# Patient Record
Sex: Male | Born: 1975 | Race: Black or African American | Hispanic: No | Marital: Single | Smoking: Current every day smoker
Health system: Southern US, Community
[De-identification: ages and names within clinical notes are randomized; demographics above are authoritative.]

## PROBLEM LIST (undated history)

## (undated) DIAGNOSIS — D571 Sickle-cell disease without crisis: Secondary | ICD-10-CM

## (undated) DIAGNOSIS — Z21 Asymptomatic human immunodeficiency virus [HIV] infection status: Secondary | ICD-10-CM

## (undated) DIAGNOSIS — I10 Essential (primary) hypertension: Secondary | ICD-10-CM

## (undated) HISTORY — PX: TONSILLECTOMY: SUR1361

---

## 2002-03-08 ENCOUNTER — Emergency Department (HOSPITAL_COMMUNITY): Admission: EM | Admit: 2002-03-08 | Discharge: 2002-03-08 | Payer: Self-pay | Admitting: Emergency Medicine

## 2002-03-08 ENCOUNTER — Encounter: Payer: Self-pay | Admitting: Emergency Medicine

## 2003-04-25 ENCOUNTER — Emergency Department (HOSPITAL_COMMUNITY): Admission: AD | Admit: 2003-04-25 | Discharge: 2003-04-26 | Payer: Self-pay | Admitting: Emergency Medicine

## 2004-05-07 ENCOUNTER — Emergency Department (HOSPITAL_COMMUNITY): Admission: EM | Admit: 2004-05-07 | Discharge: 2004-05-07 | Payer: Self-pay | Admitting: Emergency Medicine

## 2005-03-28 ENCOUNTER — Emergency Department (HOSPITAL_COMMUNITY): Admission: EM | Admit: 2005-03-28 | Discharge: 2005-03-28 | Payer: Self-pay | Admitting: Emergency Medicine

## 2005-04-20 ENCOUNTER — Emergency Department (HOSPITAL_COMMUNITY): Admission: EM | Admit: 2005-04-20 | Discharge: 2005-04-20 | Payer: Self-pay | Admitting: Emergency Medicine

## 2005-05-12 ENCOUNTER — Emergency Department (HOSPITAL_COMMUNITY): Admission: EM | Admit: 2005-05-12 | Discharge: 2005-05-12 | Payer: Self-pay | Admitting: Emergency Medicine

## 2005-05-26 ENCOUNTER — Emergency Department (HOSPITAL_COMMUNITY): Admission: EM | Admit: 2005-05-26 | Discharge: 2005-05-26 | Payer: Self-pay | Admitting: Emergency Medicine

## 2005-07-31 ENCOUNTER — Emergency Department (HOSPITAL_COMMUNITY): Admission: EM | Admit: 2005-07-31 | Discharge: 2005-07-31 | Payer: Self-pay | Admitting: Emergency Medicine

## 2005-11-12 ENCOUNTER — Emergency Department (HOSPITAL_COMMUNITY): Admission: EM | Admit: 2005-11-12 | Discharge: 2005-11-12 | Payer: Self-pay | Admitting: Emergency Medicine

## 2005-11-25 ENCOUNTER — Emergency Department (HOSPITAL_COMMUNITY): Admission: EM | Admit: 2005-11-25 | Discharge: 2005-11-25 | Payer: Self-pay | Admitting: Emergency Medicine

## 2006-07-19 ENCOUNTER — Emergency Department (HOSPITAL_COMMUNITY): Admission: EM | Admit: 2006-07-19 | Discharge: 2006-07-19 | Payer: Self-pay | Admitting: Emergency Medicine

## 2006-07-21 ENCOUNTER — Emergency Department (HOSPITAL_COMMUNITY): Admission: EM | Admit: 2006-07-21 | Discharge: 2006-07-21 | Payer: Self-pay | Admitting: Emergency Medicine

## 2007-01-30 ENCOUNTER — Emergency Department (HOSPITAL_COMMUNITY): Admission: EM | Admit: 2007-01-30 | Discharge: 2007-01-31 | Payer: Self-pay | Admitting: Emergency Medicine

## 2007-02-03 ENCOUNTER — Emergency Department (HOSPITAL_COMMUNITY): Admission: EM | Admit: 2007-02-03 | Discharge: 2007-02-03 | Payer: Self-pay | Admitting: Family Medicine

## 2009-12-23 ENCOUNTER — Emergency Department (HOSPITAL_COMMUNITY): Admission: EM | Admit: 2009-12-23 | Discharge: 2009-12-23 | Payer: Self-pay | Admitting: Emergency Medicine

## 2010-05-01 LAB — STREP A DNA PROBE: Group A Strep Probe: POSITIVE

## 2010-11-26 LAB — BASIC METABOLIC PANEL
BUN: 15
Chloride: 103
GFR calc non Af Amer: >60
Potassium: 3.5
Sodium: 135

## 2010-11-26 LAB — DIFFERENTIAL
Basophils Absolute: 0
Lymphocytes Relative: 62 — ABNORMAL HIGH
Lymphs Abs: 2.7
Monocytes Absolute: 0.3
Neutro Abs: 1.2 — ABNORMAL LOW

## 2010-11-26 LAB — ETHANOL

## 2010-11-26 LAB — RAPID URINE DRUG SCREEN, HOSP PERFORMED

## 2010-11-26 LAB — CBC
HCT: 40.3
Hemoglobin: 13.8
RBC: 4.33
RDW: 13.5
WBC: 4.3

## 2010-11-26 LAB — ACETAMINOPHEN LEVEL

## 2011-04-06 ENCOUNTER — Emergency Department (HOSPITAL_COMMUNITY)
Admission: EM | Admit: 2011-04-06 | Discharge: 2011-04-06 | Disposition: A | Discharge status: Home / Self Care | Payer: Self-pay | Attending: Emergency Medicine | Admitting: Emergency Medicine

## 2011-04-06 ENCOUNTER — Encounter (HOSPITAL_COMMUNITY): Payer: Self-pay

## 2011-04-06 DIAGNOSIS — I10 Essential (primary) hypertension: Secondary | ICD-10-CM | POA: Insufficient documentation

## 2011-04-06 DIAGNOSIS — T7840XA Allergy, unspecified, initial encounter: Secondary | ICD-10-CM | POA: Insufficient documentation

## 2011-04-06 DIAGNOSIS — R21 Rash and other nonspecific skin eruption: Secondary | ICD-10-CM | POA: Insufficient documentation

## 2011-04-06 DIAGNOSIS — H5789 Other specified disorders of eye and adnexa: Secondary | ICD-10-CM | POA: Insufficient documentation

## 2011-04-06 MED ORDER — BACITRACIN ZINC 500 UNIT/GM EX OINT
TOPICAL_OINTMENT | CUTANEOUS | Status: AC
  Filled 2011-04-06: qty 1.8

## 2011-04-06 MED ORDER — DIPHENHYDRAMINE HCL 50 MG/ML IJ SOLN
INTRAMUSCULAR | Status: AC
  Filled 2011-04-06: qty 1

## 2011-04-06 MED ORDER — FAMOTIDINE IN NACL 20-0.9 MG/50ML-% IV SOLN
INTRAVENOUS | Status: AC
  Filled 2011-04-06: qty 50

## 2011-04-06 MED ORDER — DIPHENHYDRAMINE HCL 25 MG PO CAPS
ORAL_CAPSULE | ORAL | Status: AC
  Administered 2011-04-06: 50 mg via ORAL
  Filled 2011-04-06: qty 2

## 2011-04-06 MED ORDER — DIPHENHYDRAMINE HCL 25 MG PO CAPS
50.0000 mg | ORAL_CAPSULE | Freq: Once | ORAL | Status: AC
  Administered 2011-04-06: 50 mg via ORAL

## 2011-04-06 MED ORDER — FAMOTIDINE 20 MG PO TABS
20.0000 mg | ORAL_TABLET | Freq: Once | ORAL | Status: AC
  Administered 2011-04-06: 20 mg via ORAL

## 2011-04-06 MED ORDER — FAMOTIDINE 20 MG PO TABS
ORAL_TABLET | ORAL | Status: AC
  Administered 2011-04-06: 20 mg via ORAL
  Filled 2011-04-06: qty 1

## 2011-04-06 NOTE — ED Notes (Signed)
Pt reports feeling as if his tongue started to swell apx 2 hours ago after having his eyebrows threaded and a cream applied to the area, skin around eyebrows also red and swollen with small blisters noted

## 2011-04-06 NOTE — Discharge Instructions (Signed)
Allergic Reaction, Mild to Moderate Allergies may happen from anything your body is sensitive to. This may be food, medications, pollens, chemicals, and nearly anything around you in everyday life that produces allergens. An allergen is anything that causes an allergy producing substance. Allergens cause your body to release allergic antibodies. Through a chain of events, they cause a release of histamine into the blood stream. Histamines are meant to protect you, but they also cause your discomfort. This is why antihistamines are often used for allergies. Heredity is often a factor in causing allergic reactions. This means you may have some of the same allergies as your parents. Allergies happen in all age groups. You may have some idea of what caused your reaction. There are many allergens around us. It may be difficult to know what caused your reaction. If this is a first time event, it may never happen again. Allergies cannot be cured but can be controlled with medications. SYMPTOMS  You may get some or all of the following problems from allergies.  Swelling and itching in and around the mouth.   Tearing, itchy eyes.   Nasal congestion and runny nose.   Sneezing and coughing.   An itchy red rash or hives.   Vomiting or diarrhea.   Difficulty breathing.  Seasonal allergies occur in all age groups. They are seasonal because they usually occur during the same season every year. They may be a reaction to molds, grass pollens, or tree pollens. Other causes of allergies are house dust mite allergens, pet dander and mold spores. These are just a common few of the thousands of allergens around us. All of the symptoms listed above happen when you come in contact with pollens and other allergens. Seasonal allergies are usually not life threatening. They are generally more of a nuisance that can often be handled using medications. Hay fever is a combination of all or some of the above listed allergy  problems. It may often be treated with simple over-the-counter medications such as diphenhydramine. Take medication as directed. Check with your caregiver or package insert for child dosages. TREATMENT AND HOME CARE INSTRUCTIONS If hives or rash are present:  Take medications as directed.   You may use an over-the-counter antihistamine (diphenhydramine) for hives and itching as needed. Do not drive or drink alcohol until medications used to treat the reaction have worn off. Antihistamines tend to make people sleepy.   Apply cold cloths (compresses) to the skin or take baths in cool water. This will help itching. Avoid hot baths or showers. Heat will make a rash and itching worse.   If your allergies persist and become more severe, and over the counter medications are not effective, there are many new medications your caretaker can prescribe. Immunotherapy or desensitizing injections can be used if all else fails. Follow up with your caregiver if problems continue.  SEEK MEDICAL CARE IF:   Your allergies are becoming progressively more troublesome.   You suspect a food allergy. Symptoms generally happen within 30 minutes of eating a food.   Your symptoms have not gone away within 2 days or are getting worse.   You develop new symptoms.   You want to retest yourself or your child with a food or drink you think causes an allergic reaction. Never test yourself or your child of a suspected allergy without being under the watchful eye of your caregivers. A second exposure to an allergen may be life-threatening.  SEEK IMMEDIATE MEDICAL CARE IF:  You   develop difficulty breathing or wheezing, or have a tight feeling in your chest or throat.   You develop a swollen mouth, hives, swelling, or itching all over your body.  A severe reaction with any of the above problems should be considered life-threatening. If you suddenly develop difficulty breathing call for local emergency medical help. THIS IS AN  EMERGENCY. MAKE SURE YOU:   Understand these instructions.   Will watch your condition.   Will get help right away if you are not doing well or get worse.  Document Released: 12/02/2006 Document Revised: 10/17/2010 Document Reviewed: 12/02/2006 ExitCare Patient Information 2012 [employer].  RESOURCE GUIDE  Dental Problems  Patients with Medicaid: [facility]                     [facility] [address]                                           [address] Phone:  [phone]                                                  Phone:  [phone]  If unable to pay or uninsured, contact:  [facility] or [facility] to become qualified for the adult dental clinic.  Chronic Pain Problems Contact [facility]  [phone] Patients need to be referred by their primary care doctor.  Insufficient Money for Medicine Contact United Way:  call "211" or [facility] 271-5999.  No Primary Care Doctor Call [facility]  832-8000 Other agencies that provide inexpensive medical care    [facility]  [phone]    [facility]  [phone]    [facility]  [phone]    [facility] Clinic  [phone]    [facility]  [phone]    [facility]  [phone]  Psychological Services [facility]  [phone] [facility]  [phone] [facility]   800 853-5163 (emergency services 641-4993)  Substance Abuse Resources [facility]  336-882-2125 [facility] 336-784-9470 [facility] 336-285-9073 [facility] 336-845-3988 Residential & Outpatient Substance Abuse Program  800-659-3381  Abuse/Neglect [place] [facility] Hotline (336) 641-3795 [place] [facility] Hotline 800-378-5315 (After Hours)  Emergency Shelter [facility] (336) 271-5985  [facility] (336) 275-9566 [facility] (704) 372-4663  MRSA Hotline #:   832-7006    [place] [facility]  [facility] [place]     [employer]                          [facility] [address] [place]                       [address]      [address]  [place]                                                  [place]                            [place] Phone:  [phone]                                   Phone:  [phone]                 Phone:  [phone]  [facility] Phone:  [phone]  [place] Child Abuse Hotline (336) 342-1394 (336) 342-3537 (After Hours)   

## 2011-04-06 NOTE — ED Notes (Signed)
Continuous cardiac monitor showing normal sinus rhythm, no ectopy. Continuous pulse oximetry in place. Automatic BP cuff in place.  Pt admits to having a history of hypertension, does not take medications.  Discussed with pt the importance of monitoring and getting medical follow up/treatment for his blood pressure, pt verbalizes understanding.

## 2011-04-06 NOTE — ED Notes (Signed)
Pt in c/o allergic reaction, tongue and throat swelling, states he has used his EPI pen, states he feels like swelling is getting worse, primary RN at bedside, MD Kohut notified.

## 2011-04-06 NOTE — ED Provider Notes (Signed)
History    36 year old male presenting with a presumed allergic reaction. Shortly after having his eyebrows "threaded" patient began having local irritation of his eyebrow area. Felt like his tongue was swelling as well. Patient had an EpiPen which she used. Since onset about an hour ago patient feels like symptoms have been stable. No shortness of breath. No abdominal pain nausea, vomiting or diarrhea.  CSN: [id number]  Arrival date & time 04/06/11  1342   First MD Initiated Contact with Patient 04/06/11 1344      Chief Complaint  Patient presents with  . Allergic Reaction    (Consider location/radiation/quality/duration/timing/severity/associated sxs/prior treatment) HPI  History reviewed. No pertinent past medical history.  History reviewed. No pertinent past surgical history.  History reviewed. No pertinent family history.  History  Substance Use Topics  . Smoking status: Current Everyday Smoker    Types: Cigarettes  . Smokeless tobacco: Not on file  . Alcohol Use: No      Review of Systems   Review of symptoms negative unless otherwise noted in HPI.   Allergies  Review of patient's allergies indicates no known allergies.  Home Medications  No current outpatient prescriptions on file.  BP 138/103  Pulse 60  Temp(Src) 98.6 F (37 C) (Oral)  Resp 13  SpO2 99%  Physical Exam  Nursing note and vitals reviewed. Constitutional: He appears well-developed and well-nourished. No distress.  HENT:  Head: Normocephalic and atraumatic.  Mouth/Throat: Oropharynx is clear and moist.       Oropharynx is clear. There is no appreciable facial or tongue swelling. Patient is handling his secretions. There is no stridor. Neck is supple.  Eyes: Conjunctivae and EOM are normal. Pupils are equal, round, and reactive to light. Right eye exhibits no discharge. Left eye exhibits no discharge.       Bilateral high para region with mild erythema and minimal swelling. There are no  other significant. Oral skin changes. Conjunctiva are not injected. There is no drainage.  Neck: Neck supple.  Cardiovascular: Normal rate, regular rhythm and normal heart sounds.  Exam reveals no gallop and no friction rub.   No murmur heard. Pulmonary/Chest: Effort normal and breath sounds normal. No respiratory distress.  Abdominal: Soft. He exhibits no distension. There is no tenderness.  Musculoskeletal: He exhibits no edema and no tenderness.  Neurological: He is alert.  Skin: Skin is warm and dry. He is not diaphoretic.  Psychiatric: He has a normal mood and affect. His behavior is normal. Thought content normal.    ED Course  Procedures (including critical care time)  Labs Reviewed - No data to display No results found.   1. Allergic reaction       MDM  36 year old male with symptoms consistent local irritation to "threading" of eyebrows or topical agent used afterwards or less likely allergic reaction. Given the use of an EpiPen the patient was observed for possible rebound symptoms. Throughout the observation period patient remained clinically stable and upon discharge reported that symptoms have almost completely resolved. Repeat examination continues to show no respiratory distress. Return precautions were discussed. Outpatient followup as needed.         Raeford Razor, MD 04/14/11 [phone]

## 2011-04-06 NOTE — ED Notes (Signed)
Pt is alert and oriented x 4 with respirations even and unlabored.  Pt speaking clearly and in complete sentences.  NAD at this time.  Discharge instructions reviewed with patient and patient verbalized understanding.  Pt ambulated to lobby with steady gait and significant other to transport patient home.

## 2011-04-06 NOTE — ED Notes (Signed)
Continuous cardiac monitor applied showing normal sinus rhythm, no ectopy Continuous pulse oximetry applied. Automatic BP cuff applied.  

## 2011-05-20 ENCOUNTER — Encounter (HOSPITAL_COMMUNITY): Payer: Self-pay | Admitting: *Deleted

## 2011-05-20 ENCOUNTER — Emergency Department (HOSPITAL_COMMUNITY)
Admission: EM | Admit: 2011-05-20 | Discharge: 2011-05-20 | Disposition: A | Discharge status: Home / Self Care | Payer: Self-pay | Attending: Emergency Medicine | Admitting: Emergency Medicine

## 2011-05-20 DIAGNOSIS — D571 Sickle-cell disease without crisis: Secondary | ICD-10-CM | POA: Insufficient documentation

## 2011-05-20 DIAGNOSIS — R10819 Abdominal tenderness, unspecified site: Secondary | ICD-10-CM | POA: Insufficient documentation

## 2011-05-20 DIAGNOSIS — Z21 Asymptomatic human immunodeficiency virus [HIV] infection status: Secondary | ICD-10-CM | POA: Insufficient documentation

## 2011-05-20 DIAGNOSIS — R109 Unspecified abdominal pain: Secondary | ICD-10-CM | POA: Insufficient documentation

## 2011-05-20 DIAGNOSIS — R197 Diarrhea, unspecified: Secondary | ICD-10-CM | POA: Insufficient documentation

## 2011-05-20 DIAGNOSIS — I10 Essential (primary) hypertension: Secondary | ICD-10-CM | POA: Insufficient documentation

## 2011-05-20 DIAGNOSIS — F17200 Nicotine dependence, unspecified, uncomplicated: Secondary | ICD-10-CM | POA: Insufficient documentation

## 2011-05-20 HISTORY — DX: Asymptomatic human immunodeficiency virus (hiv) infection status: Z21

## 2011-05-20 HISTORY — DX: Essential (primary) hypertension: I10

## 2011-05-20 HISTORY — DX: Sickle-cell disease without crisis: D57.1

## 2011-05-20 LAB — DIFFERENTIAL
Eosinophils Absolute: 0.1 10*3/uL (ref 0.0–0.7)
Lymphs Abs: 2 10*3/uL (ref 0.7–4.0)
Monocytes Absolute: 0.4 10*3/uL (ref 0.1–1.0)
Monocytes Relative: 8 % (ref 3–12)
Neutro Abs: 2.6 10*3/uL (ref 1.7–7.7)
Neutrophils Relative %: 51 % (ref 43–77)

## 2011-05-20 LAB — COMPREHENSIVE METABOLIC PANEL
Alkaline Phosphatase: 95 U/L (ref 39–117)
BUN: 12 mg/dL (ref 6–23)
Chloride: 106 mEq/L (ref 96–112)
GFR calc Af Amer: >90 mL/min (ref >90)
GFR calc non Af Amer: >90 mL/min (ref >90)
Glucose, Bld: 83 mg/dL (ref 70–99)
Potassium: 3.4 mEq/L — ABNORMAL LOW (ref 3.5–5.1)
Total Bilirubin: 0.2 mg/dL — ABNORMAL LOW (ref 0.3–1.2)

## 2011-05-20 LAB — CBC
HCT: 41.1 % (ref 39.0–52.0)
Hemoglobin: 14.3 g/dL (ref 13.0–17.0)
MCH: 32.8 pg (ref 26.0–34.0)
RBC: 4.36 MIL/uL (ref 4.22–5.81)

## 2011-05-20 LAB — URINALYSIS, ROUTINE W REFLEX MICROSCOPIC
Leukocytes, UA: NEGATIVE
Nitrite: NEGATIVE
Specific Gravity, Urine: 1.032 — ABNORMAL HIGH (ref 1.005–1.030)
Urobilinogen, UA: 0.2 mg/dL (ref 0.0–1.0)

## 2011-05-20 LAB — LIPASE, BLOOD: Lipase: 14 U/L (ref 11–59)

## 2011-05-20 MED ORDER — POTASSIUM CHLORIDE 20 MEQ/15ML (10%) PO LIQD
20.0000 meq | Freq: Once | ORAL | Status: AC
  Administered 2011-05-20: 20 meq via ORAL
  Filled 2011-05-20: qty 15

## 2011-05-20 MED ORDER — DICYCLOMINE HCL 10 MG/ML IM SOLN
20.0000 mg | Freq: Once | INTRAMUSCULAR | Status: AC
  Administered 2011-05-20: 20 mg via INTRAMUSCULAR
  Filled 2011-05-20: qty 2

## 2011-05-20 NOTE — ED Notes (Signed)
Pt was unable to use the bathroom for stool collection.

## 2011-05-20 NOTE — ED Notes (Signed)
Pt states "for 4 days I've been having abdominal pain & loose bowel movements, a couple of days before that I had eaten a couple of raw oysters"

## 2011-05-20 NOTE — ED Notes (Signed)
Tried to collect stool sample again and pt was unable to use the bathroom.

## 2011-05-20 NOTE — ED Provider Notes (Signed)
History     CSN: [id number]  Arrival date & time 05/20/11  1111   First MD Initiated Contact with Patient 05/20/11 1139      Chief Complaint  Patient presents with  . Abdominal Pain  . 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 The patient states that it has been present for the past 4-5 days. He describes diffuse crampy abdominal pain which has been intermittent every 15 minutes or so. This amount of watery diarrhea every 15 minutes as well. He denies fevers, blood in the stools. There is been no recent travel outside the country or swimming in [place]. He denies nausea, vomiting. He states he has a normal appetite but that occasionally he is seeing undigested food in his diarrhea. There's been no rash. He states that he did have raw oysters several days ago. With his antiretrovirals. No history of AIDS. No recent abx use.  Denies hematuria/dysuria/freq/urgency    Past Medical History  Diagnosis Date  . HIV positive   . Sickle cell anemia   . Hypertension     Past Surgical History  Procedure Date  . Tonsillectomy     No family history on file.  History  Substance Use Topics  . Smoking status: Current Everyday Smoker -- 0.5 packs/day    Types: Cigarettes  . Smokeless tobacco: Not on file  . Alcohol Use: Yes     ocassionally      Review of Systems  All other systems reviewed and are negative.   except as noted HPI   Allergies  Review of patient's allergies indicates no known allergies.  Home Medications   Current Outpatient Rx  Name Route Sig Dispense Refill  . EFAVIRENZ-EMTRICITAB-TENOFOVIR 600-200-300 MG PO TABS Oral Take 1 tablet by mouth at bedtime.    Marland Kitchen VALACYCLOVIR HCL 1 G PO TABS Oral Take 1,000 mg by mouth daily.      BP 135/97  Pulse 87  Temp(Src) 98 F (36.7 C) (Oral)   Resp 20  Wt 140 lb (63.504 kg)  SpO2 100%  Physical Exam  Nursing note and vitals reviewed. Constitutional: He is oriented to person, place, and time. He appears well-developed and well-nourished. No distress.  HENT:  Head: Atraumatic.  Mouth/Throat: Oropharynx is clear and moist.  Eyes: Conjunctivae are normal. Pupils are equal, round, and reactive to light.  Neck: Neck supple.  Cardiovascular: Normal rate, regular rhythm, normal heart sounds and intact distal pulses.  Exam reveals no gallop and no friction rub.   No murmur heard. Pulmonary/Chest: Effort normal. No respiratory distress. He has no wheezes. He has no rales.  Abdominal: Soft. Bowel sounds are normal. There is tenderness. There is no rebound and no guarding.       Minimal diffuse Abdominal tenderness to palpation  Musculoskeletal: Normal range of motion. He exhibits no edema and no tenderness.  Neurological: He is alert and oriented to person, place, and time.  Skin: Skin is warm and dry.  Psychiatric: He has a normal mood and affect.    ED Course  Procedures (including critical care time)  Labs Reviewed  COMPREHENSIVE METABOLIC PANEL - Abnormal; Notable for the following:    Potassium 3.4 (*)    Total Bilirubin 0.2 (*)    All other components within normal limits  URINALYSIS, ROUTINE W REFLEX MICROSCOPIC - Abnormal; Notable for the following:    Specific Gravity, Urine  1.032 (*)    Bilirubin Urine SMALL (*)    Ketones, ur TRACE (*)    All other components within normal limits  CBC  DIFFERENTIAL  LIPASE, BLOOD  CRYPTOSPORIDIUM SMEAR, FECAL  STOOL CULTURE   No results found.   1. Diarrhea    MDM  Crampy abdominal pain and diarrhea. H/o HIV but if patient's history is reliable, he is not immune compromised at this time. UA, stool studies incl cx and cryptosporidium, check electrolytes. Bentyl. Appears well-hydrated.   Notable for slightly decreased potassium which has been repleted in the emergency  department. His pain/cramping has improved with bentyl. Trying to give stool sample in ED. Stable for f/u as outpatient with ID or PMD.        Forbes Cellar, MD 05/20/11 1452

## 2011-05-20 NOTE — Discharge Instructions (Signed)
Diarrhea Infections caused by germs (bacterial) or a virus commonly cause diarrhea. Your caregiver has determined that with time, rest and fluids, the diarrhea should improve. In general, eat normally while drinking more water than usual. Although water may prevent dehydration, it does not contain salt and minerals (electrolytes). Broths, weak tea without caffeine and oral rehydration solutions (ORS) replace fluids and electrolytes. Small amounts of fluids should be taken frequently. Large amounts at one time may not be tolerated. Plain water may be harmful in infants and the elderly. Oral rehydrating solutions (ORS) are available at pharmacies and grocery stores. ORS replace water and important electrolytes in proper proportions. Sports drinks are not as effective as ORS and may be harmful due to sugars worsening diarrhea.  ORS is especially recommended for use in children with diarrhea. As a general guideline for children, replace any new fluid losses from diarrhea and/or vomiting with ORS as follows:   If your child weighs 22 pounds or under (10 kg or less), give 60-120 mL ( -  cup or 2 - 4 ounces) of ORS for each episode of diarrheal stool or vomiting episode.   If your child weighs more than 22 pounds (more than 10 kgs), give 120-240 mL ( - 1 cup or 4 - 8 ounces) of ORS for each diarrheal stool or episode of vomiting.   While correcting for dehydration, children should eat normally. However, foods high in sugar should be avoided because this may worsen diarrhea. Large amounts of carbonated soft drinks, juice, gelatin desserts and other highly sugared drinks should be avoided.   After correction of dehydration, other liquids that are appealing to the child may be added. Children should drink small amounts of fluids frequently and fluids should be increased as tolerated. Children should drink enough fluids to keep urine clear or pale yellow.   Adults should eat normally while drinking more fluids  than usual. Drink small amounts of fluids frequently and increase as tolerated. Drink enough fluids to keep urine clear or pale yellow. Broths, weak decaffeinated tea, lemon lime soft drinks (allowed to go flat) and ORS replace fluids and electrolytes.   Avoid:   Carbonated drinks.   Juice.   Extremely hot or cold fluids.   Caffeine drinks.   Fatty, greasy foods.   Alcohol.   Tobacco.   Too much intake of anything at one time.   Gelatin desserts.   Probiotics are active cultures of beneficial bacteria. They may lessen the amount and number of diarrheal stools in adults. Probiotics can be found in yogurt with active cultures and in supplements.   Wash hands well to avoid spreading bacteria and virus.   Anti-diarrheal medications are not recommended for infants and children.   Only take over-the-counter or prescription medicines for pain, discomfort or fever as directed by your caregiver. Do not give aspirin to children because it may cause Reye's Syndrome.   For adults, ask your caregiver if you should continue all prescribed and over-the-counter medicines.   If your caregiver has given you a follow-up appointment, it is very important to keep that appointment. Not keeping the appointment could result in a chronic or permanent injury, and disability. If there is any problem keeping the appointment, you must call back to this facility for assistance.  SEEK IMMEDIATE MEDICAL CARE IF:   You or your child is unable to keep fluids down or other symptoms or problems become worse in spite of treatment.   Vomiting or diarrhea develops and becomes persistent.     There is vomiting of blood or bile (green material).   There is blood in the stool or the stools are black and tarry.   There is no urine output in 6-8 hours or there is only a small amount of very dark urine.   Abdominal pain develops, increases or localizes.   You have a fever.   Your baby is older than 3 months with a  rectal temperature of 102 F (38.9 C) or higher.   Your baby is 3 months old or younger with a rectal temperature of 100.4 F (38 C) or higher.   You or your child develops excessive weakness, dizziness, fainting or extreme thirst.   You or your child develops a rash, stiff neck, severe headache or become irritable or sleepy and difficult to awaken.  MAKE SURE YOU:   Understand these instructions.   Will watch your condition.   Will get help right away if you are not doing well or get worse.  Document Released: 01/25/2002 Document Revised: 01/24/2011 Document Reviewed: 12/12/2008 ExitCare Patient Information 2012 [employer].  RESOURCE GUIDE  Dental Problems  Patients with Medicaid: [facility]                     [facility] [address]                                           [address] Phone:  [phone]                                                   Phone:  [phone]  If unable to pay or uninsured, contact:  [facility] or [facility] to become qualified for the adult dental clinic.  Chronic Pain Problems Contact [facility]  [phone] Patients need to be referred by their primary care doctor.  Insufficient Money for Medicine Contact United Way:  call "211" or [facility] 271-5999.  No Primary Care Doctor Call [facility]  832-8000 Other agencies that provide inexpensive medical care    [facility]  [phone]     Internal Medicine  [phone]    [facility]  [phone]    [facility]'s Clinic  [phone]    [facility]  [phone]    [facility]  [phone]  Psychological Services [facility]  [phone] [facility]  [phone] [facility]   800 853-5163 (emergency services [phone])  Abuse/Neglect [place] [facility] Hotline (336) 641-3795 [place] [facility] Hotline 800-378-5315 (After  Hours)  Emergency Shelter [facility] (336) 271-5985  [facility] (336) 275-9566 [facility] (704) 372-4663  MRSA Hotline #:   832-7006    [place] [facility]  [facility] [place]  [employer]                           [facility] [address] [place]                     [address]           [address]  [place]                                               [place]                              [place] Phone:  [phone]                                  Phone:  [phone]                   Phone:  [phone]  [facility] Phone:  [phone]  [place] Child Abuse Hotline (336) 342-1394 (336) 342-3537 (After Hours)  

## 2011-05-20 NOTE — ED Notes (Signed)
Pt is unable to go to the bathroom for labs. Gave crackers and ginger ale per Dr.

## 2011-05-22 ENCOUNTER — Emergency Department (HOSPITAL_COMMUNITY): Payer: Self-pay

## 2011-05-22 ENCOUNTER — Encounter (HOSPITAL_COMMUNITY): Payer: Self-pay | Admitting: *Deleted

## 2011-05-22 ENCOUNTER — Emergency Department (HOSPITAL_COMMUNITY)
Admission: EM | Admit: 2011-05-22 | Discharge: 2011-05-22 | Disposition: A | Discharge status: Home Health | Payer: Self-pay | Attending: Emergency Medicine | Admitting: Emergency Medicine

## 2011-05-22 DIAGNOSIS — K5289 Other specified noninfective gastroenteritis and colitis: Secondary | ICD-10-CM | POA: Insufficient documentation

## 2011-05-22 DIAGNOSIS — K529 Noninfective gastroenteritis and colitis, unspecified: Secondary | ICD-10-CM

## 2011-05-22 DIAGNOSIS — Z21 Asymptomatic human immunodeficiency virus [HIV] infection status: Secondary | ICD-10-CM | POA: Insufficient documentation

## 2011-05-22 DIAGNOSIS — R197 Diarrhea, unspecified: Secondary | ICD-10-CM | POA: Insufficient documentation

## 2011-05-22 DIAGNOSIS — Z79899 Other long term (current) drug therapy: Secondary | ICD-10-CM | POA: Insufficient documentation

## 2011-05-22 DIAGNOSIS — D571 Sickle-cell disease without crisis: Secondary | ICD-10-CM | POA: Insufficient documentation

## 2011-05-22 DIAGNOSIS — R1915 Other abnormal bowel sounds: Secondary | ICD-10-CM | POA: Insufficient documentation

## 2011-05-22 DIAGNOSIS — R1084 Generalized abdominal pain: Secondary | ICD-10-CM | POA: Insufficient documentation

## 2011-05-22 DIAGNOSIS — I10 Essential (primary) hypertension: Secondary | ICD-10-CM | POA: Insufficient documentation

## 2011-05-22 LAB — URINALYSIS, ROUTINE W REFLEX MICROSCOPIC
Nitrite: NEGATIVE
Specific Gravity, Urine: 1.039 — ABNORMAL HIGH (ref 1.005–1.030)
Urobilinogen, UA: 0.2 mg/dL (ref 0.0–1.0)

## 2011-05-22 LAB — DIFFERENTIAL
Eosinophils Absolute: 0.1 10*3/uL (ref 0.0–0.7)
Lymphs Abs: 2.2 10*3/uL (ref 0.7–4.0)
Monocytes Absolute: 0.7 10*3/uL (ref 0.1–1.0)
Monocytes Relative: 12 % (ref 3–12)
Neutrophils Relative %: 51 % (ref 43–77)

## 2011-05-22 LAB — COMPREHENSIVE METABOLIC PANEL
Alkaline Phosphatase: 87 U/L (ref 39–117)
BUN: 16 mg/dL (ref 6–23)
Creatinine, Ser: 1.11 mg/dL (ref 0.50–1.35)
GFR calc Af Amer: >90 mL/min (ref >90)
Glucose, Bld: 96 mg/dL (ref 70–99)
Potassium: 3.6 mEq/L (ref 3.5–5.1)
Total Bilirubin: 0.2 mg/dL — ABNORMAL LOW (ref 0.3–1.2)
Total Protein: 7.2 g/dL (ref 6.0–8.3)

## 2011-05-22 LAB — CBC
HCT: 41 % (ref 39.0–52.0)
Hemoglobin: 14.2 g/dL (ref 13.0–17.0)
MCH: 33.2 pg (ref 26.0–34.0)
MCV: 95.8 fL (ref 78.0–100.0)
RBC: 4.28 MIL/uL (ref 4.22–5.81)

## 2011-05-22 LAB — LIPASE, BLOOD: Lipase: 29 U/L (ref 11–59)

## 2011-05-22 MED ORDER — HYDROMORPHONE HCL PF 1 MG/ML IJ SOLN
1.0000 mg | Freq: Once | INTRAMUSCULAR | Status: AC
  Administered 2011-05-22: 1 mg via INTRAVENOUS
  Filled 2011-05-22: qty 1

## 2011-05-22 MED ORDER — METRONIDAZOLE IN NACL 5-0.79 MG/ML-% IV SOLN
500.0000 mg | Freq: Once | INTRAVENOUS | Status: AC
  Administered 2011-05-22: 500 mg via INTRAVENOUS
  Filled 2011-05-22: qty 100

## 2011-05-22 MED ORDER — IOHEXOL 300 MG/ML  SOLN
100.0000 mL | Freq: Once | INTRAMUSCULAR | Status: AC | PRN
  Administered 2011-05-22: 100 mL via INTRAVENOUS

## 2011-05-22 MED ORDER — CIPROFLOXACIN HCL 500 MG PO TABS: 500.0000 mg | ORAL_TABLET | Freq: Two times a day (BID) | ORAL | Status: AC

## 2011-05-22 MED ORDER — ONDANSETRON HCL 4 MG/2ML IJ SOLN
4.0000 mg | Freq: Once | INTRAMUSCULAR | Status: AC
  Administered 2011-05-22: 4 mg via INTRAVENOUS
  Filled 2011-05-22: qty 2

## 2011-05-22 MED ORDER — OXYCODONE-ACETAMINOPHEN 5-325 MG PO TABS: 1.0000 | ORAL_TABLET | ORAL | Status: AC | PRN

## 2011-05-22 MED ORDER — SODIUM CHLORIDE 0.9 % IV BOLUS (SEPSIS)
1000.0000 mL | Freq: Once | INTRAVENOUS | Status: AC
  Administered 2011-05-22: 1000 mL via INTRAVENOUS

## 2011-05-22 MED ORDER — METRONIDAZOLE 500 MG PO TABS: 500.0000 mg | ORAL_TABLET | Freq: Two times a day (BID) | ORAL | Status: AC

## 2011-05-22 MED ORDER — CIPROFLOXACIN IN D5W 400 MG/200ML IV SOLN
400.0000 mg | Freq: Once | INTRAVENOUS | Status: AC
  Administered 2011-05-22: 400 mg via INTRAVENOUS
  Filled 2011-05-22: qty 200

## 2011-05-22 NOTE — Discharge Instructions (Signed)
Bloody Diarrhea  Bloody diarrhea can be caused by many different conditions. Most of the time bloody diarrhea is the result of food poisoning or minor infections. Bloody diarrhea usually improves over 2 to 3 days of rest and fluid replacement. Other conditions that can cause bloody diarrhea include:  · Internal bleeding.  · Infection.  · Diseases of the bowel and colon.  Internal bleeding from an ulcer or bowel disease can be severe and requires hospital care or even surgery.  DIAGNOSIS   To find out what is wrong your caregiver may check your:  · Stool.  · Blood.  · Results from a test that looks inside the body (endoscopy).  TREATMENT   · Get plenty of rest.  · Drink enough water and fluids to keep your urine clear or pale yellow.  · Do not smoke.  · Solid foods and dairy products should be avoided until your illness improves.  · As you improve, slowly return to a regular diet with easily-digested foods first. Examples are:  · Bananas.  · Rice.  · Toast.  · Crackers.  You should only need these for about 2 days before adding more normal foods to your diet.  · Avoid spicy or fatty foods as well as caffeine and alcohol for several days.  · Medicine to control cramping and diarrhea can relieve symptoms but may prolong some cases of bloody diarrhea. Antibiotics can speed recovery from diarrhea due to some bacterial infections. Call your caregiver if diarrhea does not get better in 3 days.  SEEK MEDICAL CARE IF:   · You do not improve after 3 days.  · Your diarrhea improves but your stool appears black.  SEEK IMMEDIATE MEDICAL CARE IF:   · You become extremely weak or faint.  · You become very sweaty.  · You have increased pain or bleeding.  · You develop repeated vomiting.  · You vomit and you see blood or the vomit looks black in color.  · You have a fever.  Document Released: 02/04/2005 Document Revised: 01/24/2011 Document Reviewed: 01/06/2009  [employer]® Patient Information ©2012 [employer].

## 2011-05-22 NOTE — ED Notes (Signed)
Pt reports abd pain and diarrhea x1 wk. Denies n/v. Sts "thought I saw some blood in stool yesterday." Seen here 2 days ago for same, symptoms continued. Has tried Pepto-bismal without relief.

## 2011-05-22 NOTE — ED Provider Notes (Signed)
History     CSN: [id number]  Arrival date & time 05/22/11  0701   First MD Initiated Contact with Patient 05/22/11 [phone]      Chief Complaint  Patient presents with  . Abdominal Pain    (Consider location/radiation/quality/duration/timing/severity/associated sxs/prior treatment) HPI  Patient states abdominal pain entire abdomen for six days.  Cramping in nature, waxes and wanes.  Increases with laying on back,  Moving, or eating.  Decreases being on knees.  Patient with watery diarrhea every thirty minutes.  Patient seen here two days ago and given shot.  Patient states he has eaten oysters.  Patient drinking fluids.  States decrease urination.  No fever or chills.  ID at [facility]- 7 years- on atripla, on cd4 count "good" and viral load "undectectable" per patient.  Sickle cell trait- Dr. Alben Spittle [facility] , no local pmd.  Patient denies lightheaded or weakness.    Past Medical History  Diagnosis Date  . HIV positive   . Sickle cell anemia   . Hypertension     Past Surgical History  Procedure Date  . Tonsillectomy     No family history on file.  History  Substance Use Topics  . Smoking status: Current Everyday Smoker -- 0.5 packs/day    Types: Cigarettes  . Smokeless tobacco: Not on file  . Alcohol Use: Yes     ocassionally      Review of Systems  All other systems reviewed and are negative.    Allergies  Review of patient's allergies indicates no known allergies.  Home Medications   Current Outpatient Rx  Name Route Sig Dispense Refill  . EFAVIRENZ-EMTRICITAB-TENOFOVIR 600-200-300 MG PO TABS Oral Take 1 tablet by mouth at bedtime.    Marland Kitchen VALACYCLOVIR HCL 500 MG PO TABS Oral Take 1,000 mg by mouth 2 (two) times daily.      BP 118/78  Pulse 88  Temp(Src) 98.3 F (36.8 C) (Oral)  Resp 24  SpO2 97%  Physical Exam  Nursing note and vitals reviewed. Constitutional: He is oriented to person, place, and time. He appears well-developed and well-nourished.  HENT:    Head: Normocephalic and atraumatic.  Right Ear: External ear normal.  Left Ear: External ear normal.  Nose: Nose normal.  Mouth/Throat: Oropharynx is clear and moist.  Eyes: Conjunctivae and EOM are normal. Pupils are equal, round, and reactive to light.  Neck: Normal range of motion. Neck supple.  Cardiovascular: Normal rate, regular rhythm, normal heart sounds and intact distal pulses.   Pulmonary/Chest: Effort normal and breath sounds normal.  Abdominal: Soft. There is tenderness.       Diffuse tenderness with increased bowel sounds, positive rebound, greates rlq  Musculoskeletal: Normal range of motion.  Neurological: He is alert and oriented to person, place, and time. He has normal reflexes.  Skin: Skin is warm and dry.  Psychiatric: He has a normal mood and affect. His behavior is normal. Thought content normal.    ED Course  Procedures (including critical care time)  Labs Reviewed - No data to display No results found.   No diagnosis found.  Results for orders placed during the hospital encounter of 05/22/11  CBC      Component Value Range   WBC 6.1  4.0 - 10.5 (K/uL)   RBC 4.28  4.22 - 5.81 (MIL/uL)   Hemoglobin 14.2  13.0 - 17.0 (g/dL)   HCT 29.5  62.1 - 30.8 (%)   MCV 95.8  78.0 - 100.0 (fL)   MCH  33.2  26.0 - 34.0 (pg)   MCHC 34.6  30.0 - 36.0 (g/dL)   RDW 16.1  09.6 - 04.5 (%)   Platelets 214  150 - 400 (K/uL)  DIFFERENTIAL      Component Value Range   Neutrophils Relative 51  43 - 77 (%)   Neutro Abs 3.1  1.7 - 7.7 (K/uL)   Lymphocytes Relative 35  12 - 46 (%)   Lymphs Abs 2.2  0.7 - 4.0 (K/uL)   Monocytes Relative 12  3 - 12 (%)   Monocytes Absolute 0.7  0.1 - 1.0 (K/uL)   Eosinophils Relative 2  0 - 5 (%)   Eosinophils Absolute 0.1  0.0 - 0.7 (K/uL)   Basophils Relative 0  0 - 1 (%)   Basophils Absolute 0.0  0.0 - 0.1 (K/uL)  COMPREHENSIVE METABOLIC PANEL      Component Value Range   Sodium 139  135 - 145 (mEq/L)   Potassium 3.6  3.5 - 5.1  (mEq/L)   Chloride 106  96 - 112 (mEq/L)   CO2 24  19 - 32 (mEq/L)   Glucose, Bld 96  70 - 99 (mg/dL)   BUN 16  6 - 23 (mg/dL)   Creatinine, Ser 4.09  0.50 - 1.35 (mg/dL)   Calcium 9.0  8.4 - 81.1 (mg/dL)   Total Protein 7.2  6.0 - 8.3 (g/dL)   Albumin 3.5  3.5 - 5.2 (g/dL)   AST 21  0 - 37 (U/L)   ALT 10  0 - 53 (U/L)   Alkaline Phosphatase 87  39 - 117 (U/L)   Total Bilirubin 0.2 (*) 0.3 - 1.2 (mg/dL)   GFR calc non Af Amer 85 (*) >90 (mL/min)   GFR calc Af Amer >90  >90 (mL/min)  LIPASE, BLOOD      Component Value Range   Lipase 29  11 - 59 (U/L)  URINALYSIS, ROUTINE W REFLEX MICROSCOPIC      Component Value Range   Color, Urine YELLOW  YELLOW    APPearance CLOUDY (*) CLEAR    Specific Gravity, Urine 1.039 (*) 1.005 - 1.030    pH 5.5  5.0 - 8.0    Glucose, UA NEGATIVE  NEGATIVE (mg/dL)   Hgb urine dipstick NEGATIVE  NEGATIVE    Bilirubin Urine SMALL (*) NEGATIVE    Ketones, ur TRACE (*) NEGATIVE (mg/dL)   Protein, ur NEGATIVE  NEGATIVE (mg/dL)   Urobilinogen, UA 0.2  0.0 - 1.0 (mg/dL)   Nitrite NEGATIVE  NEGATIVE    Leukocytes, UA NEGATIVE  NEGATIVE    Ct Abdomen Pelvis W Contrast  05/22/2011  *RADIOLOGY REPORT*  Clinical Data:  abdominal pain, weight loss, diarrhea, HIV  CT ABDOMEN AND PELVIS WITH CONTRAST  Technique:  Multidetector CT imaging of the abdomen and pelvis was performed following the standard protocol during bolus administration of intravenous contrast.  Contrast:  100 ml Omnipaque-300  Comparison: None.  Findings: Minimal dependent basilar atelectasis.  Normal heart size.  No pericardial or pleural effusion.  No hiatal hernia.  Abdomen:  Liver, gallbladder, biliary system, pancreas, spleen, kidneys, and adrenal glands are within normal limits and demonstrate no acute process.  No abdominal free fluid, fluid collection, hemorrhage, hematoma, abscess, or adenopathy.  Several loops of small bowel demonstrate wall prominence/mild wall thickening involving the distal  jejunum and ileum without obstruction, pneumatosis or free air.  Appearances compatible with mild enteritis.  Colon is fluid-filled with scattered air fluid levels compatible with ongoing  diarrhea.  No colonic wall thickening or colitis identified.  Normal appendix in the right lower quadrant.  Pelvis:  No pelvic free fluid, fluid collection, hemorrhage, hematoma, adenopathy, or inguinal abnormality.  Urinary bladder unremarkable.  No acute abnormal osseous finding.  IMPRESSION: Mild distal small bowel wall thickening compatible with nonspecific enteritis.  Fluid-filled colon with air-fluid levels compatible with ongoing diarrhea  Negative for obstruction or abscess  Normal appendix  Original Report Authenticated By: Judie Petit. Ruel Favors, M.D.    MDM  Patient with normal labs. Some slight distal small bowel wall thickening seen on CT scan  patient given Cipro and Flagyl here. He'll be placed on outpatient Cipro and Flagyl. Oral rehydration is discussed with patient. He'll be given Percocet for pain.      Hilario Quarry, MD 05/23/11 [phone]

## 2011-05-24 LAB — STOOL CULTURE

## 2011-06-21 ENCOUNTER — Encounter (HOSPITAL_COMMUNITY): Payer: Self-pay | Admitting: *Deleted

## 2011-06-21 ENCOUNTER — Emergency Department (HOSPITAL_COMMUNITY)
Admission: EM | Admit: 2011-06-21 | Discharge: 2011-06-21 | Disposition: A | Discharge status: Home / Self Care | Payer: Self-pay | Attending: Emergency Medicine | Admitting: Emergency Medicine

## 2011-06-21 DIAGNOSIS — F17200 Nicotine dependence, unspecified, uncomplicated: Secondary | ICD-10-CM | POA: Insufficient documentation

## 2011-06-21 DIAGNOSIS — I10 Essential (primary) hypertension: Secondary | ICD-10-CM | POA: Insufficient documentation

## 2011-06-21 DIAGNOSIS — K625 Hemorrhage of anus and rectum: Secondary | ICD-10-CM | POA: Insufficient documentation

## 2011-06-21 DIAGNOSIS — K6289 Other specified diseases of anus and rectum: Secondary | ICD-10-CM | POA: Insufficient documentation

## 2011-06-21 DIAGNOSIS — D571 Sickle-cell disease without crisis: Secondary | ICD-10-CM | POA: Insufficient documentation

## 2011-06-21 DIAGNOSIS — Z21 Asymptomatic human immunodeficiency virus [HIV] infection status: Secondary | ICD-10-CM | POA: Insufficient documentation

## 2011-06-21 MED ORDER — LIDOCAINE HCL 1 % IJ SOLN
INTRAMUSCULAR | Status: AC
  Administered 2011-06-21: 20 mL
  Filled 2011-06-21: qty 20

## 2011-06-21 MED ORDER — DOCUSATE SODIUM 100 MG PO CAPS: 100.0000 mg | ORAL_CAPSULE | Freq: Two times a day (BID) | ORAL | Status: AC

## 2011-06-21 MED ORDER — CEFTRIAXONE SODIUM 250 MG IJ SOLR
250.0000 mg | Freq: Once | INTRAMUSCULAR | Status: AC
  Administered 2011-06-21: 250 mg via INTRAMUSCULAR
  Filled 2011-06-21: qty 250

## 2011-06-21 MED ORDER — OXYCODONE-ACETAMINOPHEN 5-325 MG PO TABS: 1.0000 | ORAL_TABLET | ORAL | Status: AC | PRN

## 2011-06-21 MED ORDER — DOXYCYCLINE HYCLATE 100 MG PO CAPS: 100.0000 mg | ORAL_CAPSULE | Freq: Two times a day (BID) | ORAL | Status: AC

## 2011-06-21 NOTE — ED Notes (Signed)
Colon biopsy 3 weeks ago to test for cancer,  Pt states he has "HIV"

## 2011-06-21 NOTE — ED Notes (Signed)
Pt denies complaints at this time. Resp easy non labored. Skin warm and dry.

## 2011-06-21 NOTE — ED Notes (Signed)
No reaction noted.

## 2011-06-21 NOTE — ED Provider Notes (Signed)
History    36 year old male with rectal pain and bleeding. Onset about 5 days ago. Did have receptive anal intercourse day of onset. Pain at rest but worse with coughing, movement and defecation. Small amount of prior red blood. Patient also reports occasionally seeing mucus in the stool, particularly in the morning. No abdominal or back pain. 2 urinary complaints. No fevers or chills. Marland Kitchen He is HIV-positive. Is followed by infectious disease doctor in [place]. Per patient, his CD4 count is "good."  CSN: [id number]  Arrival date & time 06/21/11  1948   First MD Initiated Contact with Patient 06/21/11 2138      Chief Complaint  Patient presents with  . Rectal Pain    mucous drainage from rectum in morning  . Rectal Bleeding    (Consider location/radiation/quality/duration/timing/severity/associated sxs/prior treatment) HPI  Past Medical History  Diagnosis Date  . HIV positive   . Sickle cell anemia   . Hypertension     Past Surgical History  Procedure Date  . Tonsillectomy     History reviewed. No pertinent family history.  History  Substance Use Topics  . Smoking status: Current Everyday Smoker -- 0.5 packs/day    Types: Cigarettes  . Smokeless tobacco: Not on file  . Alcohol Use: Yes     ocassionally      Review of Systems   Review of symptoms negative unless otherwise noted in HPI.   Allergies  Review of patient's allergies indicates no known allergies.  Home Medications   Current Outpatient Rx  Name Route Sig Dispense Refill  . EFAVIRENZ-EMTRICITAB-TENOFOVIR 600-200-300 MG PO TABS Oral Take 1 tablet by mouth at bedtime.    Marland Kitchen VALACYCLOVIR HCL 500 MG PO TABS Oral Take 1,000 mg by mouth 2 (two) times daily.    Marland Kitchen DOCUSATE SODIUM 100 MG PO CAPS Oral Take 1 capsule (100 mg total) by mouth 2 (two) times daily. 30 capsule 0  . DOXYCYCLINE HYCLATE 100 MG PO CAPS Oral Take 1 capsule (100 mg total) by mouth 2 (two) times daily. 28 capsule 0  .  OXYCODONE-ACETAMINOPHEN 5-325 MG PO TABS Oral Take 1 tablet by mouth every 4 (four) hours as needed for pain. 15 tablet 0    BP 141/88  Pulse 80  Temp(Src) 98.3 F (36.8 C) (Oral)  Resp 18  SpO2 98%  Physical Exam  Nursing note and vitals reviewed. Constitutional: He appears well-developed and well-nourished. No distress.  HENT:  Head: Normocephalic and atraumatic.  Eyes: Conjunctivae are normal. Right eye exhibits no discharge. Left eye exhibits no discharge.  Neck: Neck supple.  Cardiovascular: Normal rate, regular rhythm and normal heart sounds.  Exam reveals no gallop and no friction rub.   No murmur heard. Pulmonary/Chest: Effort normal and breath sounds normal. No respiratory distress.  Abdominal: Soft. He exhibits no distension. There is no tenderness.  Genitourinary:       No external lesions noted. No mass palpated. Exquisite tenderness on DRE. Small streak of blood noted. No mucus or pus. No mass palpated. Anoscopy attempted but aborted because pt would not tolerate.  Musculoskeletal: He exhibits no edema and no tenderness.  Neurological: He is alert.  Skin: Skin is warm and dry.  Psychiatric: He has a normal mood and affect. His behavior is normal. Thought content normal.    ED Course  Procedures (including critical care time)  Labs Reviewed - No data to display No results found.   1. Anal or rectal pain   2. Rectal bleed  MDM  36 year old male with rectal pain and mild bleeding. Possible anal fissure although none visualized. Will treat for possible proctitis given history of receptive anal intercourse. Pain medicines and stool softeners. Followup with his PCP or infectious disease doctor.     Raeford Razor, MD 06/21/11 2208

## 2011-09-22 ENCOUNTER — Emergency Department (HOSPITAL_COMMUNITY)
Admission: EM | Admit: 2011-09-22 | Discharge: 2011-09-23 | Disposition: A | Discharge status: Home / Self Care | Payer: Self-pay | Attending: Emergency Medicine | Admitting: Emergency Medicine

## 2011-09-22 ENCOUNTER — Encounter (HOSPITAL_COMMUNITY): Payer: Self-pay | Admitting: Emergency Medicine

## 2011-09-22 DIAGNOSIS — Z21 Asymptomatic human immunodeficiency virus [HIV] infection status: Secondary | ICD-10-CM | POA: Insufficient documentation

## 2011-09-22 DIAGNOSIS — K5289 Other specified noninfective gastroenteritis and colitis: Secondary | ICD-10-CM | POA: Insufficient documentation

## 2011-09-22 DIAGNOSIS — I10 Essential (primary) hypertension: Secondary | ICD-10-CM | POA: Insufficient documentation

## 2011-09-22 DIAGNOSIS — D571 Sickle-cell disease without crisis: Secondary | ICD-10-CM | POA: Insufficient documentation

## 2011-09-22 DIAGNOSIS — K529 Noninfective gastroenteritis and colitis, unspecified: Secondary | ICD-10-CM

## 2011-09-22 DIAGNOSIS — F17200 Nicotine dependence, unspecified, uncomplicated: Secondary | ICD-10-CM | POA: Insufficient documentation

## 2011-09-22 LAB — COMPREHENSIVE METABOLIC PANEL
Alkaline Phosphatase: 92 U/L (ref 39–117)
BUN: 14 mg/dL (ref 6–23)
Calcium: 9.4 mg/dL (ref 8.4–10.5)
GFR calc Af Amer: >90 mL/min (ref >90)
Glucose, Bld: 92 mg/dL (ref 70–99)
Potassium: 4 mEq/L (ref 3.5–5.1)
Total Protein: 7.2 g/dL (ref 6.0–8.3)

## 2011-09-22 LAB — CBC WITH DIFFERENTIAL/PLATELET
Eosinophils Absolute: 0.1 10*3/uL (ref 0.0–0.7)
Eosinophils Relative: 1 % (ref 0–5)
Hemoglobin: 14.6 g/dL (ref 13.0–17.0)
Lymphs Abs: 2.5 10*3/uL (ref 0.7–4.0)
MCH: 33.5 pg (ref 26.0–34.0)
MCV: 94.5 fL (ref 78.0–100.0)
Monocytes Absolute: 0.6 10*3/uL (ref 0.1–1.0)
Monocytes Relative: 14 % — ABNORMAL HIGH (ref 3–12)
RBC: 4.36 MIL/uL (ref 4.22–5.81)

## 2011-09-22 MED ORDER — ONDANSETRON HCL 4 MG/2ML IJ SOLN
4.0000 mg | Freq: Once | INTRAMUSCULAR | Status: AC
  Administered 2011-09-22: 4 mg via INTRAVENOUS
  Filled 2011-09-22: qty 2

## 2011-09-22 MED ORDER — SODIUM CHLORIDE 0.9 % IV BOLUS (SEPSIS)
1000.0000 mL | Freq: Once | INTRAVENOUS | Status: AC
  Administered 2011-09-22: 1000 mL via INTRAVENOUS

## 2011-09-22 MED ORDER — LOPERAMIDE HCL 2 MG PO CAPS
2.0000 mg | ORAL_CAPSULE | ORAL | Status: DC | PRN
  Administered 2011-09-22: 2 mg via ORAL
  Filled 2011-09-22: qty 1

## 2011-09-22 MED ORDER — HYDROMORPHONE HCL PF 1 MG/ML IJ SOLN
1.0000 mg | Freq: Once | INTRAMUSCULAR | Status: AC
  Administered 2011-09-22: 1 mg via INTRAVENOUS
  Filled 2011-09-22: qty 1

## 2011-09-22 NOTE — ED Notes (Signed)
Per patient starting having diarrhea last week-started having abdominal pain-unable to keep liquids/food down-drank ETOH last night-does not think that helped

## 2011-09-22 NOTE — ED Notes (Signed)
[id number]<AV> Expected date:<BR> Expected time:<BR> Means of arrival:<BR> Comments:<BR> Hold Rm 4
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
## 2011-09-23 LAB — CD4/CD8 (T-HELPER/T-SUPPRESSOR CELL)
CD8tox: 27 % (ref 15.0–40.0)
Total lymphocyte count: 2610 /uL (ref 1000–4000)

## 2011-09-23 MED ORDER — METRONIDAZOLE 500 MG PO TABS: 500.0000 mg | ORAL_TABLET | Freq: Two times a day (BID) | ORAL | Status: AC

## 2011-09-23 MED ORDER — ONDANSETRON HCL 4 MG PO TABS: 4.0000 mg | ORAL_TABLET | Freq: Four times a day (QID) | ORAL | Status: AC

## 2011-09-23 MED ORDER — CIPROFLOXACIN HCL 500 MG PO TABS: 500.0000 mg | ORAL_TABLET | Freq: Two times a day (BID) | ORAL | Status: AC

## 2011-09-23 MED ORDER — OXYCODONE-ACETAMINOPHEN 5-325 MG PO TABS: 1.0000 | ORAL_TABLET | ORAL | Status: AC | PRN

## 2011-09-25 NOTE — ED Provider Notes (Signed)
Medical screening examination/treatment/procedure(s) were performed by non-physician practitioner and as supervising physician I was immediately available for consultation/collaboration.  Azure Budnick R. Prescious Hurless, MD 09/25/11 1637 

## 2011-10-21 ENCOUNTER — Emergency Department (HOSPITAL_COMMUNITY): Payer: Self-pay

## 2011-10-21 ENCOUNTER — Emergency Department (HOSPITAL_COMMUNITY)
Admission: EM | Admit: 2011-10-21 | Discharge: 2011-10-22 | Disposition: A | Discharge status: Home / Self Care | Payer: Self-pay | Attending: Emergency Medicine | Admitting: Emergency Medicine

## 2011-10-21 ENCOUNTER — Encounter (HOSPITAL_COMMUNITY): Payer: Self-pay | Admitting: Emergency Medicine

## 2011-10-21 DIAGNOSIS — Z21 Asymptomatic human immunodeficiency virus [HIV] infection status: Secondary | ICD-10-CM | POA: Insufficient documentation

## 2011-10-21 DIAGNOSIS — I10 Essential (primary) hypertension: Secondary | ICD-10-CM | POA: Insufficient documentation

## 2011-10-21 DIAGNOSIS — F17200 Nicotine dependence, unspecified, uncomplicated: Secondary | ICD-10-CM | POA: Insufficient documentation

## 2011-10-21 DIAGNOSIS — S62609A Fracture of unspecified phalanx of unspecified finger, initial encounter for closed fracture: Secondary | ICD-10-CM | POA: Insufficient documentation

## 2011-10-21 DIAGNOSIS — D571 Sickle-cell disease without crisis: Secondary | ICD-10-CM | POA: Insufficient documentation

## 2011-10-21 DIAGNOSIS — S63259A Unspecified dislocation of unspecified finger, initial encounter: Secondary | ICD-10-CM | POA: Insufficient documentation

## 2011-10-21 MED ORDER — OXYCODONE-ACETAMINOPHEN 5-325 MG PO TABS
2.0000 | ORAL_TABLET | Freq: Once | ORAL | Status: AC
  Administered 2011-10-21: 2 via ORAL
  Filled 2011-10-21: qty 2

## 2011-10-21 MED ORDER — OXYCODONE-ACETAMINOPHEN 5-325 MG PO TABS: 1.0000 | ORAL_TABLET | Freq: Four times a day (QID) | ORAL | Status: AC | PRN

## 2011-10-21 NOTE — ED Notes (Signed)
PA Van Wingen at bedside. 

## 2011-10-21 NOTE — ED Notes (Signed)
Patient transported to X-ray 

## 2011-10-21 NOTE — ED Notes (Signed)
Ice provided for R hand.

## 2011-10-21 NOTE — ED Notes (Signed)
Pt states he has a ride home
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
## 2011-10-21 NOTE — ED Notes (Signed)
PA Anne Shutter at bedside to perform digital block on pt's finger.

## 2011-10-22 NOTE — ED Provider Notes (Signed)
Medical screening examination/treatment/procedure(s) were performed by non-physician practitioner and as supervising physician I was immediately available for consultation/collaboration.    Celene Kras, MD 10/22/11 [phone]

## 2011-11-19 ENCOUNTER — Emergency Department (HOSPITAL_COMMUNITY)
Admission: EM | Admit: 2011-11-19 | Discharge: 2011-11-19 | Disposition: A | Discharge status: Home / Self Care | Payer: Self-pay | Attending: Emergency Medicine | Admitting: Emergency Medicine

## 2011-11-19 ENCOUNTER — Encounter (HOSPITAL_COMMUNITY): Payer: Self-pay | Admitting: Emergency Medicine

## 2011-11-19 ENCOUNTER — Emergency Department (HOSPITAL_COMMUNITY): Payer: Self-pay

## 2011-11-19 DIAGNOSIS — F17200 Nicotine dependence, unspecified, uncomplicated: Secondary | ICD-10-CM | POA: Insufficient documentation

## 2011-11-19 DIAGNOSIS — Z21 Asymptomatic human immunodeficiency virus [HIV] infection status: Secondary | ICD-10-CM | POA: Insufficient documentation

## 2011-11-19 DIAGNOSIS — D571 Sickle-cell disease without crisis: Secondary | ICD-10-CM | POA: Insufficient documentation

## 2011-11-19 DIAGNOSIS — S62639A Displaced fracture of distal phalanx of unspecified finger, initial encounter for closed fracture: Secondary | ICD-10-CM

## 2011-11-19 DIAGNOSIS — S6980XA Other specified injuries of unspecified wrist, hand and finger(s), initial encounter: Secondary | ICD-10-CM | POA: Insufficient documentation

## 2011-11-19 DIAGNOSIS — S6990XA Unspecified injury of unspecified wrist, hand and finger(s), initial encounter: Secondary | ICD-10-CM

## 2011-11-19 DIAGNOSIS — I10 Essential (primary) hypertension: Secondary | ICD-10-CM | POA: Insufficient documentation

## 2011-11-19 MED ORDER — IBUPROFEN 800 MG PO TABS
800.0000 mg | ORAL_TABLET | Freq: Once | ORAL | Status: AC
  Administered 2011-11-19: 800 mg via ORAL
  Filled 2011-11-19: qty 1

## 2011-11-19 MED ORDER — OXYCODONE-ACETAMINOPHEN 5-325 MG PO TABS
2.0000 | ORAL_TABLET | Freq: Once | ORAL | Status: AC
  Administered 2011-11-19: 2 via ORAL
  Filled 2011-11-19: qty 2

## 2011-11-19 MED ORDER — OXYCODONE-ACETAMINOPHEN 5-325 MG PO TABS: 1.0000 | ORAL_TABLET | Freq: Four times a day (QID) | ORAL | Status: DC | PRN
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
## 2011-11-20 NOTE — ED Provider Notes (Signed)
Medical screening examination/treatment/procedure(s) were performed by non-physician practitioner and as supervising physician I was immediately available for consultation/collaboration. Yasmin Bronaugh, MD, FACEP   Nolene Rocks L Nels Munn, MD 11/20/11 2302 

## 2012-02-06 ENCOUNTER — Emergency Department (HOSPITAL_COMMUNITY): Payer: No Typology Code available for payment source

## 2012-02-06 ENCOUNTER — Emergency Department (HOSPITAL_COMMUNITY)
Admission: EM | Admit: 2012-02-06 | Discharge: 2012-02-06 | Disposition: A | Discharge status: Home / Self Care | Payer: No Typology Code available for payment source | Attending: Emergency Medicine | Admitting: Emergency Medicine

## 2012-02-06 ENCOUNTER — Encounter (HOSPITAL_COMMUNITY): Payer: Self-pay | Admitting: Emergency Medicine

## 2012-02-06 DIAGNOSIS — Y939 Activity, unspecified: Secondary | ICD-10-CM | POA: Insufficient documentation

## 2012-02-06 DIAGNOSIS — Z79899 Other long term (current) drug therapy: Secondary | ICD-10-CM | POA: Insufficient documentation

## 2012-02-06 DIAGNOSIS — D571 Sickle-cell disease without crisis: Secondary | ICD-10-CM | POA: Insufficient documentation

## 2012-02-06 DIAGNOSIS — B20 Human immunodeficiency virus [HIV] disease: Secondary | ICD-10-CM | POA: Insufficient documentation

## 2012-02-06 DIAGNOSIS — I10 Essential (primary) hypertension: Secondary | ICD-10-CM | POA: Insufficient documentation

## 2012-02-06 DIAGNOSIS — T148XXA Other injury of unspecified body region, initial encounter: Secondary | ICD-10-CM | POA: Insufficient documentation

## 2012-02-06 DIAGNOSIS — F17200 Nicotine dependence, unspecified, uncomplicated: Secondary | ICD-10-CM | POA: Insufficient documentation

## 2012-02-06 DIAGNOSIS — Y92410 Unspecified street and highway as the place of occurrence of the external cause: Secondary | ICD-10-CM | POA: Insufficient documentation

## 2012-02-06 MED ORDER — CYCLOBENZAPRINE HCL 10 MG PO TABS: 10.0000 mg | ORAL_TABLET | Freq: Two times a day (BID) | ORAL | Status: DC | PRN

## 2012-02-06 MED ORDER — IBUPROFEN 800 MG PO TABS: 800.0000 mg | ORAL_TABLET | Freq: Three times a day (TID) | ORAL | Status: DC

## 2012-02-06 NOTE — Progress Notes (Signed)
Pt also stated pcp at [facility] but unable to provide name
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
## 2012-02-06 NOTE — ED Provider Notes (Signed)
Medical screening examination/treatment/procedure(s) were performed by non-physician practitioner and as supervising physician I was immediately available for consultation/collaboration. Devoria Albe, MD, Armando Gang   Ward Givens, MD 02/06/12 [phone]
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
## 2012-11-23 ENCOUNTER — Emergency Department (HOSPITAL_COMMUNITY)
Admission: EM | Admit: 2012-11-23 | Discharge: 2012-11-23 | Disposition: A | Discharge status: Home / Self Care | Payer: No Typology Code available for payment source | Attending: Emergency Medicine | Admitting: Emergency Medicine

## 2012-11-23 DIAGNOSIS — F17200 Nicotine dependence, unspecified, uncomplicated: Secondary | ICD-10-CM | POA: Insufficient documentation

## 2012-11-23 DIAGNOSIS — I10 Essential (primary) hypertension: Secondary | ICD-10-CM | POA: Insufficient documentation

## 2012-11-23 DIAGNOSIS — Z862 Personal history of diseases of the blood and blood-forming organs and certain disorders involving the immune mechanism: Secondary | ICD-10-CM | POA: Insufficient documentation

## 2012-11-23 DIAGNOSIS — J029 Acute pharyngitis, unspecified: Secondary | ICD-10-CM | POA: Insufficient documentation

## 2012-11-23 DIAGNOSIS — Z21 Asymptomatic human immunodeficiency virus [HIV] infection status: Secondary | ICD-10-CM | POA: Insufficient documentation

## 2012-11-23 DIAGNOSIS — B9789 Other viral agents as the cause of diseases classified elsewhere: Secondary | ICD-10-CM

## 2012-11-23 DIAGNOSIS — Z79899 Other long term (current) drug therapy: Secondary | ICD-10-CM | POA: Insufficient documentation

## 2012-11-23 MED ORDER — IBUPROFEN 800 MG PO TABS
800.0000 mg | ORAL_TABLET | Freq: Once | ORAL | Status: AC
  Administered 2012-11-23: 800 mg via ORAL
  Filled 2012-11-23: qty 1

## 2012-11-23 NOTE — Progress Notes (Signed)
[facility] provided pt with a list of primary care resources and a [facility] [employer] application.  °

## 2012-11-23 NOTE — ED Notes (Signed)
friend at bedside.

## 2012-11-23 NOTE — ED Notes (Signed)
Pt c/o sore throat x 5 days. Pt reports exposure to kids and friends with similar symptoms.

## 2012-11-23 NOTE — ED Provider Notes (Signed)
CSN: [id number]     Arrival date & time 11/23/12  1104 History   First MD Initiated Contact with Patient 11/23/12 1204     Chief Complaint  Patient presents with  . Sore Throat   (Consider location/radiation/quality/duration/timing/severity/associated sxs/prior Treatment) The history is provided by the patient.  Jorge Elliott is a 37 y.o. male history of HIV (on meds with nl CD4 count a year ago), hypertension here presenting with sore throat. Sore for intermittently for the last week. Intermittent fevers as well. Tried NyQuil with minimal relief. He has some sick contacts with similar symptoms. He also has some nonproductive cough.    Past Medical History  Diagnosis Date  . HIV positive   . Sickle cell anemia   . Hypertension    Past Surgical History  Procedure Laterality Date  . Tonsillectomy     No family history on file. History  Substance Use Topics  . Smoking status: Current Every Day Smoker -- 0.50 packs/day    Types: Cigarettes  . Smokeless tobacco: Not on file  . Alcohol Use: Yes     Comment: ocassionally    Review of Systems  HENT: Positive for sore throat.   Respiratory: Positive for cough.   All other systems reviewed and are negative.    Allergies  Pork-derived products  Home Medications   Current Outpatient Rx  Name  Route  Sig  Dispense  Refill  . efavirenz-emtrictabine-tenofovir (ATRIPLA) 600-200-300 MG per tablet   Oral   Take 1 tablet by mouth at bedtime.         . Pseudoeph-Doxylamine-DM-APAP (NYQUIL PO)   Oral   Take 2 tablets by mouth daily as needed (cold).         . valACYclovir (VALTREX) 500 MG tablet   Oral   Take 1,000 mg by mouth daily.           BP 140/97  Pulse 70  Temp(Src) 98.6 F (37 C) (Oral)  Resp 18  SpO2 99% Physical Exam  Nursing note and vitals reviewed. Constitutional: He is oriented to person, place, and time. He appears well-developed and well-nourished.  HENT:  Head: Normocephalic.  OP slightly  red, no enlarged tonsils or exudates   Eyes: Conjunctivae are normal. Pupils are equal, round, and reactive to light.  Neck: Normal range of motion. Neck supple.  Cardiovascular: Normal rate, regular rhythm and normal heart sounds.   Pulmonary/Chest: Effort normal and breath sounds normal. No respiratory distress. He has no wheezes.  Abdominal: Soft. Bowel sounds are normal. He exhibits no distension. There is no tenderness. There is no rebound.  Musculoskeletal: Normal range of motion.  Neurological: He is alert and oriented to person, place, and time.  Skin: Skin is warm and dry.  Psychiatric: He has a normal mood and affect. His behavior is normal. Judgment and thought content normal.    ED Course  Procedures (including critical care time) Labs Review Labs Reviewed  RAPID STREP SCREEN  CULTURE, GROUP A STREP   Imaging Review No results found.  MDM  No diagnosis found. Jorge Elliott is a 37 y.o. male here with sore throat. Likely viral. Low centor score but he is HIV positive. Will do rapid strep and treat if positive.   1:45 PM Strep neg. Stable for d/c. Recommend NSAIDs at home and conservative treatment.   Richardean Canal, MD 11/23/12 1345

## 2012-11-25 LAB — CULTURE, GROUP A STREP
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
## 2015-01-16 ENCOUNTER — Emergency Department (HOSPITAL_COMMUNITY)
Admission: EM | Admit: 2015-01-16 | Discharge: 2015-01-16 | Disposition: A | Discharge status: Home / Self Care | Payer: No Typology Code available for payment source | Attending: Emergency Medicine | Admitting: Emergency Medicine

## 2015-01-16 ENCOUNTER — Encounter (HOSPITAL_COMMUNITY): Payer: Self-pay | Admitting: Emergency Medicine

## 2015-01-16 DIAGNOSIS — S199XXA Unspecified injury of neck, initial encounter: Secondary | ICD-10-CM | POA: Insufficient documentation

## 2015-01-16 DIAGNOSIS — T148XXA Other injury of unspecified body region, initial encounter: Secondary | ICD-10-CM

## 2015-01-16 DIAGNOSIS — Y92410 Unspecified street and highway as the place of occurrence of the external cause: Secondary | ICD-10-CM | POA: Diagnosis not present

## 2015-01-16 DIAGNOSIS — Z21 Asymptomatic human immunodeficiency virus [HIV] infection status: Secondary | ICD-10-CM | POA: Insufficient documentation

## 2015-01-16 DIAGNOSIS — Y9389 Activity, other specified: Secondary | ICD-10-CM | POA: Insufficient documentation

## 2015-01-16 DIAGNOSIS — F17210 Nicotine dependence, cigarettes, uncomplicated: Secondary | ICD-10-CM | POA: Insufficient documentation

## 2015-01-16 DIAGNOSIS — Z79899 Other long term (current) drug therapy: Secondary | ICD-10-CM | POA: Diagnosis not present

## 2015-01-16 DIAGNOSIS — S39012A Strain of muscle, fascia and tendon of lower back, initial encounter: Secondary | ICD-10-CM | POA: Diagnosis not present

## 2015-01-16 DIAGNOSIS — I10 Essential (primary) hypertension: Secondary | ICD-10-CM | POA: Diagnosis not present

## 2015-01-16 DIAGNOSIS — Z862 Personal history of diseases of the blood and blood-forming organs and certain disorders involving the immune mechanism: Secondary | ICD-10-CM | POA: Insufficient documentation

## 2015-01-16 DIAGNOSIS — S3992XA Unspecified injury of lower back, initial encounter: Secondary | ICD-10-CM | POA: Diagnosis present

## 2015-01-16 DIAGNOSIS — Y998 Other external cause status: Secondary | ICD-10-CM | POA: Diagnosis not present

## 2015-01-16 MED ORDER — CYCLOBENZAPRINE HCL 5 MG PO TABS: 5.0000 mg | ORAL_TABLET | Freq: Three times a day (TID) | ORAL | Status: DC | PRN
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
## 2015-01-16 NOTE — Discharge Instructions (Signed)

## 2015-01-16 NOTE — ED Notes (Signed)
Pt stated "I have high blood.  I didn't take my medicine today"  Deliah BostonVrinda, NP informed.  No new orders received.
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
## 2015-01-26 ENCOUNTER — Encounter (HOSPITAL_COMMUNITY): Payer: Self-pay | Admitting: Emergency Medicine

## 2015-01-26 ENCOUNTER — Emergency Department (HOSPITAL_COMMUNITY)
Admission: EM | Admit: 2015-01-26 | Discharge: 2015-01-26 | Disposition: A | Discharge status: Home / Self Care | Payer: No Typology Code available for payment source | Attending: Emergency Medicine | Admitting: Emergency Medicine

## 2015-01-26 DIAGNOSIS — Z79899 Other long term (current) drug therapy: Secondary | ICD-10-CM | POA: Insufficient documentation

## 2015-01-26 DIAGNOSIS — I10 Essential (primary) hypertension: Secondary | ICD-10-CM | POA: Diagnosis not present

## 2015-01-26 DIAGNOSIS — F17210 Nicotine dependence, cigarettes, uncomplicated: Secondary | ICD-10-CM | POA: Diagnosis not present

## 2015-01-26 DIAGNOSIS — Z87828 Personal history of other (healed) physical injury and trauma: Secondary | ICD-10-CM | POA: Diagnosis not present

## 2015-01-26 DIAGNOSIS — Z862 Personal history of diseases of the blood and blood-forming organs and certain disorders involving the immune mechanism: Secondary | ICD-10-CM | POA: Diagnosis not present

## 2015-01-26 DIAGNOSIS — M545 Low back pain: Secondary | ICD-10-CM | POA: Diagnosis not present

## 2015-01-26 DIAGNOSIS — Z21 Asymptomatic human immunodeficiency virus [HIV] infection status: Secondary | ICD-10-CM | POA: Insufficient documentation

## 2015-01-26 MED ORDER — CYCLOBENZAPRINE HCL 5 MG PO TABS: 5.0000 mg | ORAL_TABLET | Freq: Three times a day (TID) | ORAL | Status: DC | PRN

## 2015-01-26 MED ORDER — IBUPROFEN 800 MG PO TABS: 800.0000 mg | ORAL_TABLET | Freq: Three times a day (TID) | ORAL | Status: DC

## 2015-01-26 NOTE — Discharge Instructions (Signed)

## 2015-01-26 NOTE — ED Notes (Signed)
Pt states "I want to avoid pain killers 'cause 4 yrs ago I had a problem with them."
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
## 2015-08-21 ENCOUNTER — Emergency Department (HOSPITAL_COMMUNITY)
Admission: EM | Admit: 2015-08-21 | Discharge: 2015-08-21 | Disposition: A | Discharge status: Home / Self Care | Payer: No Typology Code available for payment source | Attending: Emergency Medicine | Admitting: Emergency Medicine

## 2015-08-21 ENCOUNTER — Encounter (HOSPITAL_COMMUNITY): Payer: Self-pay | Admitting: Emergency Medicine

## 2015-08-21 DIAGNOSIS — I10 Essential (primary) hypertension: Secondary | ICD-10-CM | POA: Insufficient documentation

## 2015-08-21 DIAGNOSIS — Z79899 Other long term (current) drug therapy: Secondary | ICD-10-CM | POA: Insufficient documentation

## 2015-08-21 DIAGNOSIS — F17210 Nicotine dependence, cigarettes, uncomplicated: Secondary | ICD-10-CM | POA: Insufficient documentation

## 2015-08-21 DIAGNOSIS — Z791 Long term (current) use of non-steroidal anti-inflammatories (NSAID): Secondary | ICD-10-CM | POA: Insufficient documentation

## 2015-08-21 DIAGNOSIS — R369 Urethral discharge, unspecified: Secondary | ICD-10-CM

## 2015-08-21 DIAGNOSIS — Z21 Asymptomatic human immunodeficiency virus [HIV] infection status: Secondary | ICD-10-CM | POA: Insufficient documentation

## 2015-08-21 LAB — GC/CHLAMYDIA PROBE AMP (~~LOC~~) NOT AT ARMC
CHLAMYDIA, DNA PROBE: NEGATIVE
Neisseria Gonorrhea: POSITIVE — AB

## 2015-08-21 MED ORDER — LIDOCAINE HCL 1 % IJ SOLN
INTRAMUSCULAR | Status: AC
  Administered 2015-08-21: 0.9 mL
  Filled 2015-08-21: qty 20

## 2015-08-21 MED ORDER — AZITHROMYCIN 250 MG PO TABS
1000.0000 mg | ORAL_TABLET | Freq: Once | ORAL | Status: AC
  Administered 2015-08-21: 1000 mg via ORAL
  Filled 2015-08-21: qty 4

## 2015-08-21 MED ORDER — ONDANSETRON 4 MG PO TBDP
4.0000 mg | ORAL_TABLET | Freq: Once | ORAL | Status: AC
  Administered 2015-08-21: 4 mg via ORAL
  Filled 2015-08-21: qty 1

## 2015-08-21 MED ORDER — CEFTRIAXONE SODIUM 250 MG IJ SOLR
250.0000 mg | Freq: Once | INTRAMUSCULAR | Status: AC
  Administered 2015-08-21: 250 mg via INTRAMUSCULAR
  Filled 2015-08-21: qty 250

## 2015-08-21 NOTE — Discharge Instructions (Signed)
Safe Sex  Safe sex is about reducing the risk of giving or getting a sexually transmitted disease (STD). STDs are spread through sexual contact involving the genitals, mouth, or rectum. Some STDs can be cured and others cannot. Safe sex can also prevent unintended pregnancies.   WHAT ARE SOME SAFE SEX PRACTICES?  · Limit your sexual activity to only one partner who is having sex with only you.  · Talk to your partner about his or her past partners, past STDs, and drug use.  · Use a condom every time you have sexual intercourse. This includes vaginal, oral, and anal sexual activity. Both females and males should wear condoms during oral sex. Only use latex or polyurethane condoms and water-based lubricants. Using petroleum-based lubricants or oils to lubricate a condom will weaken the condom and increase the chance that it will break. The condom should be in place from the beginning to the end of sexual activity. Wearing a condom reduces, but does not completely eliminate, your risk of getting or giving an STD. STDs can be spread by contact with infected body fluids and skin.  · Get vaccinated for hepatitis B and HPV.  · Avoid alcohol and recreational drugs, which can affect your judgment. You may forget to use a condom or participate in high-risk sex.  · For females, avoid douching after sexual intercourse. Douching can spread an infection farther into the reproductive tract.  · Check your body for signs of sores, blisters, rashes, or unusual discharge. See your health care provider if you notice any of these signs.  · Avoid sexual contact if you have symptoms of an infection or are being treated for an STD. If you or your partner has herpes, avoid sexual contact when blisters are present. Use condoms at all other times.  · If you are at risk of being infected with HIV, it is recommended that you take a prescription medicine daily to prevent HIV infection. This is called pre-exposure prophylaxis (PrEP). You are  considered at risk if:    You are a man who has sex with other men (MSM).    You are a heterosexual man or woman who is sexually active with more than one partner.    You take drugs by injection.    You are sexually active with a partner who has HIV.  · Talk with your health care provider about whether you are at high risk of being infected with HIV. If you choose to begin PrEP, you should first be tested for HIV. You should then be tested every 3 months for as long as you are taking PrEP.  · See your health care provider for regular screenings, exams, and tests for other STDs. Before having sex with a new partner, each of you should be screened for STDs and should talk about the results with each other.  WHAT ARE THE BENEFITS OF SAFE SEX?   · There is less chance of getting or giving an STD.  · You can prevent unwanted or unintended pregnancies.  · By discussing safe sex concerns with your partner, you may increase feelings of intimacy, comfort, trust, and honesty between the two of you.     This information is not intended to replace advice given to you by your health care provider. Make sure you discuss any questions you have with your health care provider.     Document Released: 03/14/2004 Document Revised: 02/25/2014 Document Reviewed: 07/29/2011  Elsevier Interactive Patient Education ©2016 [employer]
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
## 2015-08-21 NOTE — ED Notes (Signed)
Pt reports understanding of discharge information. No questions at time of discharge 
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
## 2015-08-22 ENCOUNTER — Telehealth (HOSPITAL_BASED_OUTPATIENT_CLINIC_OR_DEPARTMENT_OTHER): Payer: Self-pay | Admitting: Emergency Medicine

## 2016-03-29 ENCOUNTER — Encounter (HOSPITAL_COMMUNITY): Payer: Self-pay | Admitting: Emergency Medicine

## 2016-03-29 ENCOUNTER — Emergency Department (HOSPITAL_COMMUNITY)
Admission: EM | Admit: 2016-03-29 | Discharge: 2016-03-29 | Disposition: A | Discharge status: Home / Self Care | Payer: Self-pay | Attending: Emergency Medicine | Admitting: Emergency Medicine

## 2016-03-29 DIAGNOSIS — J029 Acute pharyngitis, unspecified: Secondary | ICD-10-CM | POA: Insufficient documentation

## 2016-03-29 DIAGNOSIS — F17210 Nicotine dependence, cigarettes, uncomplicated: Secondary | ICD-10-CM | POA: Insufficient documentation

## 2016-03-29 DIAGNOSIS — I10 Essential (primary) hypertension: Secondary | ICD-10-CM | POA: Insufficient documentation

## 2016-03-29 DIAGNOSIS — J069 Acute upper respiratory infection, unspecified: Secondary | ICD-10-CM | POA: Insufficient documentation

## 2016-03-29 DIAGNOSIS — Z79899 Other long term (current) drug therapy: Secondary | ICD-10-CM | POA: Insufficient documentation

## 2016-03-29 MED ORDER — CETIRIZINE HCL 10 MG PO TABS: 10.0000 mg | ORAL_TABLET | Freq: Every day | ORAL | 0 refills | Status: AC

## 2016-03-29 MED ORDER — FLUTICASONE PROPIONATE 50 MCG/ACT NA SUSP: NASAL | 1 refills | Status: AC

## 2016-03-29 NOTE — ED Provider Notes (Signed)
[facility]-EMERGENCY DEPT Provider Note   CSN: [id number] Arrival date & time: 03/29/16  1217     History   Chief Complaint Chief Complaint  Patient presents with  . Sore Throat  . Headache  . Hypertension    HPI Jorge Elliott is a 41 y.o. male. Complaint is dry throat  HPI:  41 year old male. History of check the. Followed by ID. States his CD4 is normal and his iris is undetectable and he is compliant. History of hypertension. Takes is single medicine at midnight each day. He does not recall the name.  He's had a "dry throat" for the last several days. He has some sinus drainage but no frank pressure. No fevers. Is not short of breath. He's been taking multiple over-the-counter cold medicines including Sudafed and Afrin nasal spray. His pressure was high at triage he was placed in an acute bed rather than last track because of this. His pressures are improving.  Past Medical History:  Diagnosis Date  . HIV positive (HCC)   . Hypertension   . Sickle cell anemia (HCC)     There are no active problems to display for this patient.   Past Surgical History:  Procedure Laterality Date  . TONSILLECTOMY         Home Medications    Prior to Admission medications   Medication Sig Start Date End Date Taking? Authorizing Provider  cetirizine (ZYRTEC) 10 MG tablet Take 1 tablet (10 mg total) by mouth daily. 1 po q day prn allergies 03/29/16   Rolland PorterMark Krystie Leiter, MD  cyclobenzaprine (FLEXERIL) 5 MG tablet Take 1 tablet (5 mg total) by mouth 3 (three) times daily as needed for muscle spasms. 01/26/15   Elson AreasLeslie K Sofia, PA-C  efavirenz-emtrictabine-tenofovir (ATRIPLA) 600-200-300 MG per tablet Take 1 tablet by mouth at bedtime.    Historical Provider, MD  fluticasone Aleda Grana(FLONASE) 50 MCG/ACT nasal spray 1 spray each nares twice a day 03/29/16   Rolland PorterMark Carrisa Keller, MD  ibuprofen (ADVIL,MOTRIN) 800 MG tablet Take 1 tablet (800 mg total) by mouth 3 (three) times daily. 01/26/15   Elson AreasLeslie K Sofia, PA-C    Pseudoeph-Doxylamine-DM-APAP (NYQUIL PO) Take 2 tablets by mouth daily as needed (cold).    Historical Provider, MD  valACYclovir (VALTREX) 500 MG tablet Take 1,000 mg by mouth daily.     Historical Provider, MD    Family History Family History  Problem Relation Age of Onset  . Hypertension Other   . Diabetes Other   . Sickle cell anemia Other     Social History Social History  Substance Use Topics  . Smoking status: Current Every Day Smoker    Packs/day: 0.50    Types: Cigarettes  . Smokeless tobacco: Not on file  . Alcohol use Yes     Comment: ocassionally     Allergies   Pork-derived products   Review of Systems Review of Systems  Constitutional: Negative for appetite change, chills, diaphoresis, fatigue and fever.  HENT: Positive for congestion, sinus pressure and sore throat. Negative for mouth sores and trouble swallowing.   Eyes: Negative for visual disturbance.  Respiratory: Negative for cough, chest tightness, shortness of breath and wheezing.   Cardiovascular: Negative for chest pain.  Gastrointestinal: Negative for abdominal distention, abdominal pain, diarrhea, nausea and vomiting.  Endocrine: Negative for polydipsia, polyphagia and polyuria.  Genitourinary: Negative for dysuria, frequency and hematuria.  Musculoskeletal: Negative for gait problem.  Skin: Negative for color change, pallor and rash.  Neurological: Negative for dizziness, syncope, light-headedness  and headaches.  Hematological: Does not bruise/bleed easily.  Psychiatric/Behavioral: Negative for behavioral problems and confusion.     Physical Exam Updated Vital Signs BP (!) 165/109 (BP Location: Right Arm)   Pulse (!) 59   Temp 98.3 F (36.8 C) (Oral)   Resp 18   Wt 171 lb (77.6 kg)   SpO2 100%   BMI 24.54 kg/m   Physical Exam  Constitutional: He is oriented to person, place, and time. He appears well-developed and well-nourished. No distress.  HENT:  Head: Normocephalic.   Thanks benign. No erythema. No asymmetry. No exudate. No adenopathy in the neck. No strawberry tongue. Petechiae on the palate. Afebrile.  Eyes: Conjunctivae are normal. Pupils are equal, round, and reactive to light. No scleral icterus.  Neck: Normal range of motion. Neck supple. No thyromegaly present.  Cardiovascular: Normal rate and regular rhythm.  Exam reveals no gallop and no friction rub.   No murmur heard. Pulmonary/Chest: Effort normal and breath sounds normal. No respiratory distress. He has no wheezes. He has no rales.  Abdominal: Soft. Bowel sounds are normal. He exhibits no distension. There is no tenderness. There is no rebound.  Musculoskeletal: Normal range of motion.  Neurological: He is alert and oriented to person, place, and time.  Skin: Skin is warm and dry. No rash noted.  Psychiatric: He has a normal mood and affect. His behavior is normal.     ED Treatments / Results  Labs (all labs ordered are listed, but only abnormal results are displayed) Labs Reviewed - No data to display  EKG  EKG Interpretation None       Radiology No results found.  Procedures Procedures (including critical care time)  Medications Ordered in ED Medications - No data to display   Initial Impression / Assessment and Plan / ED Course  I have reviewed the triage vital signs and the nursing notes.  Pertinent labs & imaging results that were available during my care of the patient were reviewed by me and considered in my medical decision making (see chart for details).     Hypertension. Likely essential hypertension exacerbated by her cold meds. Will give Zyrtec, and Flonase to use for his cold symptoms and sore throat and sinus pressure. No indication for testing or antibiotics at this time. Follow-up with his primary care physician.  Final Clinical Impressions(s) / ED Diagnoses   Final diagnoses:  Viral pharyngitis  Hypertension, unspecified type  Upper respiratory  tract infection, unspecified type    New Prescriptions New Prescriptions   CETIRIZINE (ZYRTEC) 10 MG TABLET    Take 1 tablet (10 mg total) by mouth daily. 1 po q day prn allergies   FLUTICASONE (FLONASE) 50 MCG/ACT NASAL SPRAY    1 spray each nares twice a day     Rolland Porter, MD 03/29/16 1640

## 2016-03-29 NOTE — Discharge Instructions (Signed)
Take Flonase, and Zyrtec for your cold symptoms. Do not take over-the-counter sinus or cold medicines, they can increase your blood pressure.

## 2016-03-29 NOTE — ED Notes (Signed)
Pt reports sore throat x 3 days, started to have a h/a yesterday.  Pt has hx of HTN and takes meds daily.  He reports that he has been taking OTC meds for his sore throat and URI sxs.  He is unsure if the OTC meds has DM which pt made aware can elevate his BP.  Pt is A&Ox 4.  No obvious neuro deficits noted.

## 2016-03-29 NOTE — ED Triage Notes (Signed)
Patient c/o sore throat x couple days. Patient spoke with a pharmacist and has tried taking Mucinex, thermaflu OTC and nose spray which hasnt helped.  Patient also c/o of headaches that are intermittent and states he feels his BP is high.  Patient states that he takes HTN medications daily as prescribed.

## 2017-05-09 ENCOUNTER — Emergency Department (HOSPITAL_COMMUNITY)
Admission: EM | Admit: 2017-05-09 | Discharge: 2017-05-09 | Disposition: A | Discharge status: Home / Self Care | Payer: Self-pay | Attending: Emergency Medicine | Admitting: Emergency Medicine

## 2017-05-09 ENCOUNTER — Other Ambulatory Visit: Payer: Self-pay

## 2017-05-09 ENCOUNTER — Emergency Department (HOSPITAL_COMMUNITY): Payer: Self-pay

## 2017-05-09 ENCOUNTER — Encounter (HOSPITAL_COMMUNITY): Payer: Self-pay

## 2017-05-09 DIAGNOSIS — I10 Essential (primary) hypertension: Secondary | ICD-10-CM | POA: Insufficient documentation

## 2017-05-09 DIAGNOSIS — F17210 Nicotine dependence, cigarettes, uncomplicated: Secondary | ICD-10-CM | POA: Insufficient documentation

## 2017-05-09 DIAGNOSIS — Z79899 Other long term (current) drug therapy: Secondary | ICD-10-CM | POA: Insufficient documentation

## 2017-05-09 DIAGNOSIS — R51 Headache: Secondary | ICD-10-CM | POA: Insufficient documentation

## 2017-05-09 DIAGNOSIS — B20 Human immunodeficiency virus [HIV] disease: Secondary | ICD-10-CM | POA: Insufficient documentation

## 2017-05-09 DIAGNOSIS — R519 Headache, unspecified: Secondary | ICD-10-CM

## 2017-05-09 LAB — CBC WITH DIFFERENTIAL/PLATELET
BASOS ABS: 0 10*3/uL (ref 0.0–0.1)
BASOS PCT: 0 %
Eosinophils Absolute: 0.5 10*3/uL (ref 0.0–0.7)
Eosinophils Relative: 9 %
HEMATOCRIT: 44 % (ref 39.0–52.0)
HEMOGLOBIN: 15.2 g/dL (ref 13.0–17.0)
Lymphocytes Relative: 54 %
Lymphs Abs: 2.9 10*3/uL (ref 0.7–4.0)
MCH: 33.9 pg (ref 26.0–34.0)
MCHC: 34.5 g/dL (ref 30.0–36.0)
MCV: 98 fL (ref 78.0–100.0)
MONOS PCT: 9 %
Monocytes Absolute: 0.5 10*3/uL (ref 0.1–1.0)
NEUTROS ABS: 1.5 10*3/uL — AB (ref 1.7–7.7)
NEUTROS PCT: 28 %
Platelets: 312 10*3/uL (ref 150–400)
RBC: 4.49 MIL/uL (ref 4.22–5.81)
RDW: 13.3 % (ref 11.5–15.5)
WBC: 5.4 10*3/uL (ref 4.0–10.5)

## 2017-05-09 LAB — BASIC METABOLIC PANEL
ANION GAP: 8 (ref 5–15)
BUN: 11 mg/dL (ref 6–20)
CALCIUM: 9.3 mg/dL (ref 8.9–10.3)
CO2: 27 mmol/L (ref 22–32)
Chloride: 106 mmol/L (ref 101–111)
Creatinine, Ser: 1.21 mg/dL (ref 0.61–1.24)
GFR calc non Af Amer: >60 mL/min (ref >60)
Glucose, Bld: 96 mg/dL (ref 65–99)
Potassium: 3.9 mmol/L (ref 3.5–5.1)
Sodium: 141 mmol/L (ref 135–145)

## 2017-05-09 MED ORDER — KETOROLAC TROMETHAMINE 30 MG/ML IJ SOLN
30.0000 mg | Freq: Once | INTRAMUSCULAR | Status: AC
  Administered 2017-05-09: 30 mg via INTRAVENOUS
  Filled 2017-05-09: qty 1

## 2017-05-09 MED ORDER — METOCLOPRAMIDE HCL 5 MG/ML IJ SOLN
10.0000 mg | Freq: Once | INTRAMUSCULAR | Status: AC
  Administered 2017-05-09: 10 mg via INTRAVENOUS
  Filled 2017-05-09: qty 2

## 2017-05-09 NOTE — ED Notes (Signed)
Patient transported to X-ray 

## 2017-05-09 NOTE — ED Provider Notes (Signed)
[facility]-EMERGENCY DEPT Provider Note   CSN: [id number] Arrival date & time: 05/09/17  1121     History   Chief Complaint Chief Complaint  Patient presents with  . Hypertension    HPI Jorge Elliott is a 42 y.o. male.  He presents with not feeling well for a few days.  He had a gradual onset of a headache is diffuse and throbbing its been getting worse for 3 days.  He is tried nothing for it.  He is also had a cough with productive productive of some phlegm.  Some myalgias.  He felt like his blood pressure might be high when he checked it it was elevated.  He has been taking his medications regular.  He states he has had some intermittent spots in his vision.  He denies any drugs but admits to infrequent smoking and alcohol.  He does have a history of HIV and he said his last counts were undetectable. The history is provided by the patient.  Hypertension  This is a chronic problem. The current episode started more than 2 days ago. The problem has not changed since onset.Associated symptoms include headaches. Pertinent negatives include no chest pain, no abdominal pain and no shortness of breath. Nothing aggravates the symptoms. Nothing relieves the symptoms. He has tried nothing for the symptoms. The treatment provided no relief.    Past Medical History:  Diagnosis Date  . HIV positive (HCC)   . Hypertension   . Sickle cell anemia (HCC)     There are no active problems to display for this patient.   Past Surgical History:  Procedure Laterality Date  . TONSILLECTOMY          Home Medications    Prior to Admission medications   Medication Sig Start Date End Date Taking? Authorizing Provider  lisinopril-hydrochlorothiazide (PRINZIDE,ZESTORETIC) 20-12.5 MG tablet Take 1 tablet by mouth daily. 03/07/17  Yes [provider]  TRIUMEQ 600-50-300 MG tablet Take 1 tablet by mouth daily.  05/06/17  Yes [provider]  valACYclovir  (VALTREX) 500 MG tablet Take 1,000 mg by mouth daily.    Yes [provider]  cetirizine (ZYRTEC) 10 MG tablet Take 1 tablet (10 mg total) by mouth daily. 1 po q day prn allergies Patient not taking: Reported on 05/09/2017 03/29/16   Rolland Porter, MD  fluticasone [facility]) 50 MCG/ACT nasal spray 1 spray each nares twice a day Patient not taking: Reported on 05/09/2017 03/29/16   Rolland Porter, MD    Family History Family History  Problem Relation Age of Onset  . Hypertension Other   . Diabetes Other   . Sickle cell anemia Other     Social History Social History   Tobacco Use  . Smoking status: Current Every Day Smoker    Packs/day: 0.15    Types: Cigarettes  . Smokeless tobacco: Never Used  Substance Use Topics  . Alcohol use: Yes    Comment: ocassionally  . Drug use: No     Allergies   Pork-derived products   Review of Systems Review of Systems  Constitutional: Negative for chills and fever.  HENT: Negative for ear pain and sore throat.   Eyes: Positive for visual disturbance. Negative for pain.  Respiratory: Negative for cough and shortness of breath.   Cardiovascular: Negative for chest pain and palpitations.  Gastrointestinal: Negative for abdominal pain and vomiting.  Genitourinary: Negative for dysuria and hematuria.  Musculoskeletal: Negative for arthralgias and back pain.  Skin:  Negative for color change and rash.  Neurological: Positive for headaches. Negative for seizures and syncope.  All other systems reviewed and are negative.    Physical Exam Updated Vital Signs BP (!) 154/119   Pulse 80   Temp 97.7 F (36.5 C) (Oral)   Resp 18   Ht 5\' 10"  (1.778 m)   Wt 78 kg (172 lb)   SpO2 96%   BMI 24.68 kg/m   Physical Exam  Constitutional: He is oriented to person, place, and time. He appears well-developed and well-nourished.  HENT:  Head: Normocephalic and atraumatic.  Eyes: Conjunctivae are normal.  Neck: Neck supple.  Cardiovascular: Normal  rate and regular rhythm.  No murmur heard. Pulmonary/Chest: Effort normal and breath sounds normal. No respiratory distress.  Abdominal: Soft. There is no tenderness.  Musculoskeletal: He exhibits no edema, tenderness or deformity.  Neurological: He is alert and oriented to person, place, and time. He has normal strength. No cranial nerve deficit or sensory deficit. Gait normal. GCS eye subscore is 4. GCS verbal subscore is 5. GCS motor subscore is 6.  Skin: Skin is warm and dry.  Psychiatric: He has a normal mood and affect.  Nursing note and vitals reviewed.    ED Treatments / Results  Labs (all labs ordered are listed, but only abnormal results are displayed) Labs Reviewed  CBC WITH DIFFERENTIAL/PLATELET - Abnormal; Notable for the following components:      Result Value   Neutro Abs 1.5 (*)    All other components within normal limits  BASIC METABOLIC PANEL    EKG None  Radiology Dg Chest 2 View  Result Date: 05/09/2017 CLINICAL DATA:  Cough. EXAM: CHEST - 2 VIEW COMPARISON:  11/12/2005. FINDINGS: Interval mild enlargement of the cardiac silhouette. Clear lungs with normal vascularity. Mild scoliosis. IMPRESSION: Interval mild cardiomegaly. Electronically Signed   By: Beckie SaltsSteven  Reid M.D.   On: 05/09/2017 14:08    Procedures Procedures (including critical care time)  Medications Ordered in ED Medications  metoCLOPramide (REGLAN) injection 10 mg (has no administration in time range)  ketorolac (TORADOL) 30 MG/ML injection 30 mg (has no administration in time range)     Initial Impression / Assessment and Plan / ED Course  I have reviewed the triage vital signs and the nursing notes.  Pertinent labs & imaging results that were available during my care of the patient were reviewed by me and considered in my medical decision making (see chart for details).  Clinical Course as of May 10 1915  Fri May 09, 2017  1444 Well-appearing male with HIV undetectable counts here  with elevated blood pressure and headache in the setting of what sounds like a viral illness of cough and congestion.  This could be fluid does not recall getting a flu shot.  Been going on for a few days.  Neurologically intact.  Differential diagnosis includes hypertensive emergency,   [MB]  1552 Reevaluated him after Toradol and Reglan.  States his headache is much improved.  Repeated his blood pressure he was 140 over mid 90s but he is comfortable going home with this.  I think that is reasonable he should continue to take his regular medications.   [MB]    Clinical Course User Index [MB] Terrilee FilesButler, Michael C, MD     Final Clinical Impressions(s) / ED Diagnoses   Final diagnoses:  Essential hypertension  Acute nonintractable headache, unspecified headache type    ED Discharge Orders    None  Terrilee Files, MD 05/09/17 [phone]

## 2017-05-09 NOTE — ED Triage Notes (Signed)
Patient c/o "seeing spots", headache and states he has a history of HTN.Patient states he has been compliant with HTN medication.

## 2017-05-09 NOTE — Discharge Instructions (Addendum)
Your evaluated in the emergency department for an elevated blood pressure and headache.  Your lab work and chest x-ray were unremarkable.  Your headache improved with some medications and your blood pressure is also slightly improved.  He will need to continue your regular medicines and follow-up with your doctor.  Please return if any worsening symptoms.

## 2017-08-20 ENCOUNTER — Emergency Department (HOSPITAL_COMMUNITY)
Admission: EM | Admit: 2017-08-20 | Discharge: 2017-08-20 | Disposition: A | Discharge status: Home / Self Care | Payer: Self-pay | Attending: Emergency Medicine | Admitting: Emergency Medicine

## 2017-08-20 ENCOUNTER — Other Ambulatory Visit: Payer: Self-pay

## 2017-08-20 ENCOUNTER — Encounter (HOSPITAL_COMMUNITY): Payer: Self-pay

## 2017-08-20 DIAGNOSIS — Z79899 Other long term (current) drug therapy: Secondary | ICD-10-CM | POA: Insufficient documentation

## 2017-08-20 DIAGNOSIS — F17210 Nicotine dependence, cigarettes, uncomplicated: Secondary | ICD-10-CM | POA: Insufficient documentation

## 2017-08-20 DIAGNOSIS — I10 Essential (primary) hypertension: Secondary | ICD-10-CM | POA: Insufficient documentation

## 2017-08-20 DIAGNOSIS — B20 Human immunodeficiency virus [HIV] disease: Secondary | ICD-10-CM | POA: Insufficient documentation

## 2017-08-20 DIAGNOSIS — D571 Sickle-cell disease without crisis: Secondary | ICD-10-CM | POA: Insufficient documentation

## 2017-08-20 DIAGNOSIS — J029 Acute pharyngitis, unspecified: Secondary | ICD-10-CM | POA: Insufficient documentation

## 2017-08-20 LAB — GROUP A STREP BY PCR: Group A Strep by PCR: NOT DETECTED

## 2017-08-20 NOTE — ED Triage Notes (Signed)
Pt c/o of a sore throat, saying he woke up this morning and hurts to swallow.

## 2017-08-20 NOTE — ED Provider Notes (Signed)
[facility]-EMERGENCY DEPT Provider Note   CSN: [id number] Arrival date & time: 08/20/17  0743     History   Chief Complaint Chief Complaint  Patient presents with  . Sore Throat    HPI Jorge Elliott is a 42 y.o. male.  Patient is a 42 year old male who presents with a sore throat.  He states he had a 2-day history of pain in the back of his throat and pain on swallowing.  He also states it burns up into his nose.  He has a little bit of nasal drainage.  He has had fevers up to 102.  Has had some nausea but no vomiting.  No abdominal pain.  No cough or chest congestion.  No rashes.  He has not been using medications for the symptoms.     Past Medical History:  Diagnosis Date  . HIV positive (HCC)   . Hypertension   . Sickle cell anemia (HCC)     There are no active problems to display for this patient.   Past Surgical History:  Procedure Laterality Date  . TONSILLECTOMY          Home Medications    Prior to Admission medications   Medication Sig Start Date End Date Taking? Authorizing Provider  lisinopril-hydrochlorothiazide (PRINZIDE,ZESTORETIC) 20-12.5 MG tablet Take 1 tablet by mouth daily. 03/07/17  Yes [provider]  TRIUMEQ 600-50-300 MG tablet Take 1 tablet by mouth daily.  05/06/17  Yes [provider]  valACYclovir (VALTREX) 1000 MG tablet Take 1 tablet by mouth daily. 08/08/17  Yes [provider]  cetirizine (ZYRTEC) 10 MG tablet Take 1 tablet (10 mg total) by mouth daily. 1 po q day prn allergies Patient not taking: Reported on 05/09/2017 03/29/16   Rolland PorterJames, Mark, MD  fluticasone [facility](FLONASE) 50 MCG/ACT nasal spray 1 spray each nares twice a day Patient not taking: Reported on 05/09/2017 03/29/16   Rolland PorterJames, Mark, MD    Family History Family History  Problem Relation Age of Onset  . Hypertension Other   . Diabetes Other   . Sickle cell anemia Other     Social History Social History   Tobacco Use  .  Smoking status: Current Every Day Smoker    Packs/day: 0.15    Types: Cigarettes  . Smokeless tobacco: Never Used  Substance Use Topics  . Alcohol use: Yes    Comment: ocassionally  . Drug use: No     Allergies   Pork-derived products   Review of Systems Review of Systems  Constitutional: Negative for chills, diaphoresis, fatigue and fever.  HENT: Positive for congestion and sore throat. Negative for rhinorrhea and sneezing.   Eyes: Negative.   Respiratory: Negative for cough, chest tightness and shortness of breath.   Cardiovascular: Negative for chest pain and leg swelling.  Gastrointestinal: Negative for abdominal pain, blood in stool, diarrhea, nausea and vomiting.  Genitourinary: Negative for difficulty urinating, flank pain, frequency and hematuria.  Musculoskeletal: Negative for arthralgias and back pain.  Skin: Negative for rash.  Neurological: Negative for dizziness, speech difficulty, weakness, numbness and headaches.     Physical Exam Updated Vital Signs BP (!) 147/98   Pulse (!) 55   Temp 97.6 F (36.4 C) (Oral)   Resp 18   Ht 5\' 10"  (1.778 m)   Wt 80.7 kg (178 lb)   SpO2 99%   BMI 25.54 kg/m   Physical Exam  Constitutional: He is oriented to person, place, and time. He appears well-developed  and well-nourished.  HENT:  Head: Normocephalic and atraumatic.  Right Ear: Tympanic membrane normal.  Left Ear: Tympanic membrane normal.  Mouth/Throat: Uvula is midline. Posterior oropharyngeal erythema present. No oropharyngeal exudate or posterior oropharyngeal edema.  Mild erythema in the posterior pharynx, no exudates, uvula is midline with no peritonsillar fullness, no trismus  Eyes: Pupils are equal, round, and reactive to light.  Neck: Normal range of motion. Neck supple.  Cardiovascular: Normal rate, regular rhythm and normal heart sounds.  Pulmonary/Chest: Effort normal and breath sounds normal. No respiratory distress. He has no wheezes. He has no  rales. He exhibits no tenderness.  Abdominal: Soft. Bowel sounds are normal. There is no tenderness. There is no rebound and no guarding.  Musculoskeletal: Normal range of motion. He exhibits no edema.  Lymphadenopathy:    He has no cervical adenopathy.  Neurological: He is alert and oriented to person, place, and time.  Skin: Skin is warm and dry. No rash noted.  Psychiatric: He has a normal mood and affect.     ED Treatments / Results  Labs (all labs ordered are listed, but only abnormal results are displayed) Labs Reviewed  GROUP A STREP BY PCR    EKG None  Radiology No results found.  Procedures Procedures (including critical care time)  Medications Ordered in ED Medications - No data to display   Initial Impression / Assessment and Plan / ED Course  I have reviewed the triage vital signs and the nursing notes.  Pertinent labs & imaging results that were available during my care of the patient were reviewed by me and considered in my medical decision making (see chart for details).     Patient is a 42 year old male who presents with a sore throat.  He has some minor erythema of the posterior pharynx but otherwise his throat exam is benign.  There is no suggestions of a peritonsillar abscess or deeper tissue infection.  He is otherwise well-appearing.  He does have a history of HIV but it appears to be well controlled based on his prior infectious disease notes.  I do not see any evidence of thrush currently.  This is likely viral, especially given his concurrent fever.  He was discharged home in good condition.  He was given symptomatic care instructions.  Return precautions were given.  Final Clinical Impressions(s) / ED Diagnoses   Final diagnoses:  Viral pharyngitis    ED Discharge Orders    None       Rolan Bucco, MD 08/20/17 [phone]
